# Patient Record
Sex: Female | Born: 1975 | Race: Black or African American | Hispanic: No | Marital: Single | Smoking: Current every day smoker
Health system: Southern US, Community
[De-identification: ages and names within clinical notes are randomized; demographics above are authoritative.]

## PROBLEM LIST (undated history)

## (undated) DIAGNOSIS — I10 Essential (primary) hypertension: Secondary | ICD-10-CM

## (undated) DIAGNOSIS — R51 Headache: Secondary | ICD-10-CM

## (undated) DIAGNOSIS — M1990 Unspecified osteoarthritis, unspecified site: Secondary | ICD-10-CM

## (undated) DIAGNOSIS — K219 Gastro-esophageal reflux disease without esophagitis: Secondary | ICD-10-CM

## (undated) DIAGNOSIS — Z8719 Personal history of other diseases of the digestive system: Secondary | ICD-10-CM

## (undated) DIAGNOSIS — R519 Headache, unspecified: Secondary | ICD-10-CM

## (undated) HISTORY — PX: TUBAL LIGATION: SHX77

## (undated) HISTORY — PX: SPINE SURGERY: SHX786

## (undated) HISTORY — PX: WISDOM TOOTH EXTRACTION: SHX21

## (undated) HISTORY — PX: BACK SURGERY: SHX140

---

## 2012-03-02 ENCOUNTER — Other Ambulatory Visit: Payer: Self-pay | Admitting: Family Medicine

## 2012-03-02 DIAGNOSIS — M545 Low back pain: Secondary | ICD-10-CM

## 2012-03-04 ENCOUNTER — Ambulatory Visit (HOSPITAL_COMMUNITY)
Admission: RE | Admit: 2012-03-04 | Discharge: 2012-03-04 | Disposition: A | Discharge status: Home / Self Care | Payer: Medicaid Other | Source: Ambulatory Visit | Attending: Family Medicine | Admitting: Family Medicine

## 2012-03-04 DIAGNOSIS — M5126 Other intervertebral disc displacement, lumbar region: Secondary | ICD-10-CM | POA: Insufficient documentation

## 2012-03-04 DIAGNOSIS — M545 Low back pain, unspecified: Secondary | ICD-10-CM | POA: Insufficient documentation

## 2012-03-04 DIAGNOSIS — R209 Unspecified disturbances of skin sensation: Secondary | ICD-10-CM | POA: Insufficient documentation

## 2012-07-19 ENCOUNTER — Ambulatory Visit (HOSPITAL_COMMUNITY): Admission: RE | Admit: 2012-07-19 | Payer: Medicaid Other | Source: Ambulatory Visit

## 2012-07-19 ENCOUNTER — Other Ambulatory Visit: Payer: Self-pay | Admitting: Family Medicine

## 2012-07-19 DIAGNOSIS — T148XXA Other injury of unspecified body region, initial encounter: Secondary | ICD-10-CM

## 2012-07-20 ENCOUNTER — Ambulatory Visit (HOSPITAL_COMMUNITY)
Admission: RE | Admit: 2012-07-20 | Discharge: 2012-07-20 | Disposition: A | Discharge status: Home / Self Care | Payer: Medicaid Other | Source: Ambulatory Visit | Attending: Family Medicine | Admitting: Family Medicine

## 2012-07-20 DIAGNOSIS — M25579 Pain in unspecified ankle and joints of unspecified foot: Secondary | ICD-10-CM | POA: Insufficient documentation

## 2012-07-20 DIAGNOSIS — S8263XA Displaced fracture of lateral malleolus of unspecified fibula, initial encounter for closed fracture: Secondary | ICD-10-CM | POA: Insufficient documentation

## 2012-07-20 DIAGNOSIS — T148XXA Other injury of unspecified body region, initial encounter: Secondary | ICD-10-CM

## 2012-07-20 DIAGNOSIS — X500XXA Overexertion from strenuous movement or load, initial encounter: Secondary | ICD-10-CM | POA: Insufficient documentation

## 2012-09-22 ENCOUNTER — Telehealth: Payer: Self-pay | Admitting: General Practice

## 2012-09-22 NOTE — Telephone Encounter (Signed)
Unable to reach patient on telephone number provided, invalid number.

## 2012-09-28 NOTE — Telephone Encounter (Signed)
FOUND ANOTHER NUMBER YOU CAN USE WOULD CALL ON THE RESULTS BUT DONT SEE THEM [phone]

## 2012-10-21 ENCOUNTER — Other Ambulatory Visit: Payer: Self-pay | Admitting: Neurosurgery

## 2012-12-10 ENCOUNTER — Encounter (HOSPITAL_COMMUNITY): Payer: Self-pay

## 2012-12-10 ENCOUNTER — Encounter (HOSPITAL_COMMUNITY)
Admission: RE | Admit: 2012-12-10 | Discharge: 2012-12-10 | Disposition: A | Discharge status: Home / Self Care | Payer: Medicaid Other | Source: Ambulatory Visit | Attending: Neurosurgery | Admitting: Neurosurgery

## 2012-12-10 HISTORY — DX: Unspecified osteoarthritis, unspecified site: M19.90

## 2012-12-10 HISTORY — DX: Personal history of other diseases of the digestive system: Z87.19

## 2012-12-10 LAB — CBC
HCT: 43 % (ref 36.0–46.0)
MCHC: 35.1 g/dL (ref 30.0–36.0)
MCV: 79.9 fL (ref 78.0–100.0)
Platelets: 234 10*3/uL (ref 150–400)
RDW: 13 % (ref 11.5–15.5)

## 2012-12-10 LAB — SURGICAL PCR SCREEN
MRSA, PCR: NEGATIVE
Staphylococcus aureus: NEGATIVE

## 2012-12-10 LAB — BASIC METABOLIC PANEL
BUN: 10 mg/dL (ref 6–23)
CO2: 23 mEq/L (ref 19–32)
Calcium: 8.9 mg/dL (ref 8.4–10.5)
Chloride: 104 mEq/L (ref 96–112)
Creatinine, Ser: 1.1 mg/dL (ref 0.50–1.10)

## 2012-12-10 NOTE — Pre-Procedure Instructions (Signed)
Toni Kim  12/10/2012   Your procedure is scheduled on:  12/21/12  Report to [facility] at 530 AM.  Call this number if you have problems the morning of surgery: [phone]   Remember:   Do not eat food or drink liquids after midnight.   Take these medicines the morning of surgery with A SIP OF WATER:    Do not wear jewelry, make-up or nail polish.  Do not wear lotions, powders, or perfumes. You may wear deodorant.  Do not shave 48 hours prior to surgery. Men may shave face and neck.  Do not bring valuables to the hospital.  [facility] is not responsible                   for any belongings or valuables.  Contacts, dentures or bridgework may not be worn into surgery.  Leave suitcase in the car. After surgery it may be brought to your room.  For patients admitted to the hospital, checkout time is 11:00 AM the day of  discharge.   Patients discharged the day of surgery will not be allowed to drive  home.  Name and phone number of your driver: family  Special Instructions: Shower using CHG 2 nights before surgery and the night before surgery.  If you shower the day of surgery use CHG.  Use special wash - you have one bottle of CHG for all showers.  You should use approximately 1/3 of the bottle for each shower.   Please read over the following fact sheets that you were given: Pain Booklet, Coughing and Deep Breathing, MRSA Information and Surgical Site Infection Prevention

## 2012-12-21 ENCOUNTER — Ambulatory Visit (HOSPITAL_COMMUNITY): Payer: Medicaid Other

## 2012-12-21 ENCOUNTER — Encounter (HOSPITAL_COMMUNITY): Payer: Self-pay | Admitting: Anesthesiology

## 2012-12-21 ENCOUNTER — Encounter (HOSPITAL_COMMUNITY)
Admission: RE | Disposition: A | Discharge status: Home / Self Care | Payer: Self-pay | Source: Ambulatory Visit | Attending: Neurosurgery

## 2012-12-21 ENCOUNTER — Ambulatory Visit (HOSPITAL_COMMUNITY): Payer: Medicaid Other | Admitting: Anesthesiology

## 2012-12-21 ENCOUNTER — Inpatient Hospital Stay (HOSPITAL_COMMUNITY)
Admission: RE | Admit: 2012-12-21 | Discharge: 2012-12-23 | DRG: 491 | Disposition: A | Discharge status: Home / Self Care | Payer: Medicaid Other | Source: Ambulatory Visit | Attending: Neurosurgery | Admitting: Neurosurgery

## 2012-12-21 DIAGNOSIS — Z79899 Other long term (current) drug therapy: Secondary | ICD-10-CM

## 2012-12-21 DIAGNOSIS — F3289 Other specified depressive episodes: Secondary | ICD-10-CM | POA: Diagnosis present

## 2012-12-21 DIAGNOSIS — F17200 Nicotine dependence, unspecified, uncomplicated: Secondary | ICD-10-CM | POA: Diagnosis present

## 2012-12-21 DIAGNOSIS — M5126 Other intervertebral disc displacement, lumbar region: Principal | ICD-10-CM | POA: Diagnosis present

## 2012-12-21 DIAGNOSIS — F329 Major depressive disorder, single episode, unspecified: Secondary | ICD-10-CM | POA: Diagnosis present

## 2012-12-21 DIAGNOSIS — Z01812 Encounter for preprocedural laboratory examination: Secondary | ICD-10-CM

## 2012-12-21 DIAGNOSIS — F1210 Cannabis abuse, uncomplicated: Secondary | ICD-10-CM | POA: Diagnosis present

## 2012-12-21 HISTORY — PX: LUMBAR LAMINECTOMY/DECOMPRESSION MICRODISCECTOMY: SHX5026

## 2012-12-21 SURGERY — LUMBAR LAMINECTOMY/DECOMPRESSION MICRODISCECTOMY 1 LEVEL
Anesthesia: General | Site: Back | Laterality: Right | Wound class: Clean

## 2012-12-21 MED ORDER — ONDANSETRON HCL 4 MG/2ML IJ SOLN: 4.0000 mg | INTRAMUSCULAR | Status: DC | PRN

## 2012-12-21 MED ORDER — FENTANYL CITRATE 0.05 MG/ML IJ SOLN
INTRAMUSCULAR | Status: AC
  Filled 2012-12-21: qty 2

## 2012-12-21 MED ORDER — BUPIVACAINE LIPOSOME 1.3 % IJ SUSP
INTRAMUSCULAR | Status: DC | PRN
  Administered 2012-12-21: 20 mL

## 2012-12-21 MED ORDER — PHENOL 1.4 % MT LIQD: 1.0000 | OROMUCOSAL | Status: DC | PRN

## 2012-12-21 MED ORDER — ONDANSETRON HCL 4 MG/2ML IJ SOLN
INTRAMUSCULAR | Status: DC | PRN
  Administered 2012-12-21: 4 mg via INTRAVENOUS

## 2012-12-21 MED ORDER — GLYCOPYRROLATE 0.2 MG/ML IJ SOLN
INTRAMUSCULAR | Status: DC | PRN
  Administered 2012-12-21: 0.6 mg via INTRAVENOUS

## 2012-12-21 MED ORDER — BUPIVACAINE LIPOSOME 1.3 % IJ SUSP
20.0000 mL | INTRAMUSCULAR | Status: AC
  Filled 2012-12-21: qty 20

## 2012-12-21 MED ORDER — ONDANSETRON HCL 4 MG/2ML IJ SOLN: 4.0000 mg | Freq: Once | INTRAMUSCULAR | Status: DC | PRN

## 2012-12-21 MED ORDER — METHYLPREDNISOLONE ACETATE 80 MG/ML IJ SUSP
INTRAMUSCULAR | Status: DC | PRN
  Administered 2012-12-21: 80 mg

## 2012-12-21 MED ORDER — LIDOCAINE HCL (CARDIAC) 20 MG/ML IV SOLN
INTRAVENOUS | Status: DC | PRN
  Administered 2012-12-21: 100 mg via INTRAVENOUS

## 2012-12-21 MED ORDER — THROMBIN 5000 UNITS EX SOLR
CUTANEOUS | Status: DC | PRN
  Administered 2012-12-21 (×2): 5000 [IU] via TOPICAL

## 2012-12-21 MED ORDER — FENTANYL CITRATE 0.05 MG/ML IJ SOLN
INTRAMUSCULAR | Status: DC | PRN
  Administered 2012-12-21: 100 ug via INTRAVENOUS

## 2012-12-21 MED ORDER — 0.9 % SODIUM CHLORIDE (POUR BTL) OPTIME
TOPICAL | Status: DC | PRN
  Administered 2012-12-21: 1000 mL

## 2012-12-21 MED ORDER — MORPHINE SULFATE 2 MG/ML IJ SOLN
1.0000 mg | INTRAMUSCULAR | Status: DC | PRN
  Administered 2012-12-21 (×2): 4 mg via INTRAVENOUS
  Administered 2012-12-21 – 2012-12-22 (×4): 2 mg via INTRAVENOUS
  Administered 2012-12-22: 4 mg via INTRAVENOUS
  Administered 2012-12-23 (×2): 2 mg via INTRAVENOUS
  Filled 2012-12-21 (×3): qty 1
  Filled 2012-12-21 (×2): qty 2
  Filled 2012-12-21 (×3): qty 1
  Filled 2012-12-21: qty 2

## 2012-12-21 MED ORDER — MENTHOL 3 MG MT LOZG: 1.0000 | LOZENGE | OROMUCOSAL | Status: DC | PRN

## 2012-12-21 MED ORDER — ACETAMINOPHEN 650 MG RE SUPP: 650.0000 mg | RECTAL | Status: DC | PRN

## 2012-12-21 MED ORDER — HYDROMORPHONE HCL PF 1 MG/ML IJ SOLN
INTRAMUSCULAR | Status: AC
  Filled 2012-12-21: qty 1

## 2012-12-21 MED ORDER — SODIUM CHLORIDE 0.9 % IV SOLN
250.0000 mL | INTRAVENOUS | Status: DC
Start: 2012-12-21 — End: 2012-12-23

## 2012-12-21 MED ORDER — SODIUM CHLORIDE 0.9 % IJ SOLN
3.0000 mL | Freq: Two times a day (BID) | INTRAMUSCULAR | Status: DC
  Administered 2012-12-21 – 2012-12-22 (×2): 3 mL via INTRAVENOUS

## 2012-12-21 MED ORDER — ZOLPIDEM TARTRATE 5 MG PO TABS
5.0000 mg | ORAL_TABLET | Freq: Every evening | ORAL | Status: DC | PRN
  Administered 2012-12-22: 5 mg via ORAL
  Filled 2012-12-21: qty 1

## 2012-12-21 MED ORDER — MIDAZOLAM HCL 5 MG/5ML IJ SOLN
INTRAMUSCULAR | Status: DC | PRN
  Administered 2012-12-21: 2 mg via INTRAVENOUS

## 2012-12-21 MED ORDER — ACETAMINOPHEN 325 MG PO TABS: 650.0000 mg | ORAL_TABLET | ORAL | Status: DC | PRN

## 2012-12-21 MED ORDER — LIDOCAINE HCL 4 % MT SOLN
OROMUCOSAL | Status: DC | PRN
  Administered 2012-12-21: 4 mL via TOPICAL

## 2012-12-21 MED ORDER — ROCURONIUM BROMIDE 100 MG/10ML IV SOLN
INTRAVENOUS | Status: DC | PRN
  Administered 2012-12-21: 50 mg via INTRAVENOUS

## 2012-12-21 MED ORDER — SODIUM CHLORIDE 0.9 % IV SOLN
INTRAVENOUS | Status: DC
  Administered 2012-12-21 – 2012-12-23 (×3): via INTRAVENOUS

## 2012-12-21 MED ORDER — HEMOSTATIC AGENTS (NO CHARGE) OPTIME
TOPICAL | Status: DC | PRN
  Administered 2012-12-21: 1 via TOPICAL

## 2012-12-21 MED ORDER — SODIUM CHLORIDE 0.9 % IJ SOLN: 3.0000 mL | INTRAMUSCULAR | Status: DC | PRN

## 2012-12-21 MED ORDER — GABAPENTIN 300 MG PO CAPS
300.0000 mg | ORAL_CAPSULE | Freq: Three times a day (TID) | ORAL | Status: DC
  Administered 2012-12-21 – 2012-12-23 (×6): 300 mg via ORAL
  Filled 2012-12-21 (×8): qty 1

## 2012-12-21 MED ORDER — SENNA 8.6 MG PO TABS
1.0000 | ORAL_TABLET | Freq: Two times a day (BID) | ORAL | Status: DC
  Administered 2012-12-21 – 2012-12-23 (×4): 8.6 mg via ORAL
  Filled 2012-12-21 (×5): qty 1

## 2012-12-21 MED ORDER — FENTANYL CITRATE 0.05 MG/ML IJ SOLN
INTRAMUSCULAR | Status: DC | PRN
  Administered 2012-12-21 (×2): 100 ug via INTRAVENOUS
  Administered 2012-12-21: 50 ug via INTRAVENOUS

## 2012-12-21 MED ORDER — NEOSTIGMINE METHYLSULFATE 1 MG/ML IJ SOLN
INTRAMUSCULAR | Status: DC | PRN
  Administered 2012-12-21: 4 mg via INTRAVENOUS

## 2012-12-21 MED ORDER — HYDROMORPHONE HCL PF 1 MG/ML IJ SOLN
0.2500 mg | INTRAMUSCULAR | Status: DC | PRN
  Administered 2012-12-21 (×6): 0.5 mg via INTRAVENOUS

## 2012-12-21 MED ORDER — CEFAZOLIN SODIUM 1-5 GM-% IV SOLN
1.0000 g | Freq: Three times a day (TID) | INTRAVENOUS | Status: AC
  Administered 2012-12-21 (×2): 1 g via INTRAVENOUS
  Filled 2012-12-21 (×4): qty 50

## 2012-12-21 MED ORDER — OXYCODONE-ACETAMINOPHEN 5-325 MG PO TABS
1.0000 | ORAL_TABLET | ORAL | Status: DC | PRN
  Administered 2012-12-21: 2 via ORAL
  Administered 2012-12-21: 1 via ORAL
  Administered 2012-12-22 – 2012-12-23 (×6): 2 via ORAL
  Filled 2012-12-21 (×8): qty 2

## 2012-12-21 MED ORDER — PROPOFOL 10 MG/ML IV BOLUS
INTRAVENOUS | Status: DC | PRN
  Administered 2012-12-21: 200 mg via INTRAVENOUS

## 2012-12-21 MED ORDER — LACTATED RINGERS IV SOLN
INTRAVENOUS | Status: DC | PRN
  Administered 2012-12-21 (×2): via INTRAVENOUS

## 2012-12-21 MED ORDER — CEFAZOLIN SODIUM-DEXTROSE 2-3 GM-% IV SOLR
INTRAVENOUS | Status: AC
  Administered 2012-12-21: 2 g via INTRAVENOUS
  Filled 2012-12-21: qty 50

## 2012-12-21 SURGICAL SUPPLY — 55 items
BENZOIN TINCTURE PRP APPL 2/3 (GAUZE/BANDAGES/DRESSINGS) ×2 IMPLANT
BLADE SURG ROTATE 9660 (MISCELLANEOUS) IMPLANT
BUR ACORN 6.0 (BURR) ×2 IMPLANT
BUR MATCHSTICK NEURO 3.0 LAGG (BURR) ×2 IMPLANT
CANISTER SUCTION 2500CC (MISCELLANEOUS) ×2 IMPLANT
CLOTH BEACON ORANGE TIMEOUT ST (SAFETY) ×2 IMPLANT
CONT SPEC 4OZ CLIKSEAL STRL BL (MISCELLANEOUS) ×2 IMPLANT
DRAPE LAPAROTOMY 100X72X124 (DRAPES) ×2 IMPLANT
DRAPE MICROSCOPE LEICA (MISCELLANEOUS) ×2 IMPLANT
DRAPE POUCH INSTRU U-SHP 10X18 (DRAPES) ×2 IMPLANT
DRSG PAD ABDOMINAL 8X10 ST (GAUZE/BANDAGES/DRESSINGS) IMPLANT
DURAPREP 26ML APPLICATOR (WOUND CARE) ×2 IMPLANT
ELECT BLADE 4.0 EZ CLEAN MEGAD (MISCELLANEOUS) ×2
ELECT REM PT RETURN 9FT ADLT (ELECTROSURGICAL) ×2
ELECTRODE BLDE 4.0 EZ CLN MEGD (MISCELLANEOUS) ×1 IMPLANT
ELECTRODE REM PT RTRN 9FT ADLT (ELECTROSURGICAL) ×1 IMPLANT
GAUZE SPONGE 4X4 16PLY XRAY LF (GAUZE/BANDAGES/DRESSINGS) IMPLANT
GLOVE BIOGEL M 8.0 STRL (GLOVE) ×4 IMPLANT
GLOVE BIOGEL PI IND STRL 8 (GLOVE) ×3 IMPLANT
GLOVE BIOGEL PI INDICATOR 8 (GLOVE) ×3
GLOVE ECLIPSE 7.5 STRL STRAW (GLOVE) ×8 IMPLANT
GLOVE EXAM NITRILE LRG STRL (GLOVE) IMPLANT
GLOVE EXAM NITRILE MD LF STRL (GLOVE) IMPLANT
GLOVE EXAM NITRILE XL STR (GLOVE) IMPLANT
GLOVE EXAM NITRILE XS STR PU (GLOVE) IMPLANT
GOWN BRE IMP SLV AUR LG STRL (GOWN DISPOSABLE) ×2 IMPLANT
GOWN BRE IMP SLV AUR XL STRL (GOWN DISPOSABLE) ×2 IMPLANT
GOWN STRL REIN 2XL LVL4 (GOWN DISPOSABLE) ×4 IMPLANT
KIT BASIN OR (CUSTOM PROCEDURE TRAY) ×2 IMPLANT
KIT ROOM TURNOVER OR (KITS) ×2 IMPLANT
NEEDLE HYPO 18GX1.5 BLUNT FILL (NEEDLE) ×2 IMPLANT
NEEDLE HYPO 21X1.5 SAFETY (NEEDLE) ×2 IMPLANT
NEEDLE HYPO 25X1 1.5 SAFETY (NEEDLE) ×2 IMPLANT
NEEDLE SPNL 20GX3.5 QUINCKE YW (NEEDLE) ×2 IMPLANT
NS IRRIG 1000ML POUR BTL (IV SOLUTION) ×2 IMPLANT
PACK LAMINECTOMY NEURO (CUSTOM PROCEDURE TRAY) ×2 IMPLANT
PAD ARMBOARD 7.5X6 YLW CONV (MISCELLANEOUS) ×10 IMPLANT
PATTIES SURGICAL .5 X1 (DISPOSABLE) ×2 IMPLANT
RUBBERBAND STERILE (MISCELLANEOUS) ×4 IMPLANT
SENSORCAINE 0.5% W/EPI 1:200,000 IMPLANT
SPONGE GAUZE 4X4 12PLY (GAUZE/BANDAGES/DRESSINGS) ×2 IMPLANT
SPONGE LAP 4X18 X RAY DECT (DISPOSABLE) IMPLANT
SPONGE SURGIFOAM ABS GEL SZ50 (HEMOSTASIS) ×2 IMPLANT
STRIP CLOSURE SKIN 1/2X4 (GAUZE/BANDAGES/DRESSINGS) ×2 IMPLANT
SUT VIC AB 0 CT1 18XCR BRD8 (SUTURE) ×1 IMPLANT
SUT VIC AB 0 CT1 8-18 (SUTURE) ×1
SUT VIC AB 2-0 CP2 18 (SUTURE) ×2 IMPLANT
SUT VIC AB 3-0 SH 8-18 (SUTURE) ×2 IMPLANT
SYR 20CC LL (SYRINGE) ×2 IMPLANT
SYR 20ML ECCENTRIC (SYRINGE) ×2 IMPLANT
SYR 5ML LL (SYRINGE) ×2 IMPLANT
TAPE CLOTH SURG 4X10 WHT LF (GAUZE/BANDAGES/DRESSINGS) ×2 IMPLANT
TOWEL OR 17X24 6PK STRL BLUE (TOWEL DISPOSABLE) ×2 IMPLANT
TOWEL OR 17X26 10 PK STRL BLUE (TOWEL DISPOSABLE) ×2 IMPLANT
WATER STERILE IRR 1000ML POUR (IV SOLUTION) ×2 IMPLANT

## 2012-12-21 NOTE — Anesthesia Preprocedure Evaluation (Addendum)
Anesthesia Evaluation  Patient identified by MRN, date of birth, ID band Patient awake, Patient confused and Patient unresponsive    Reviewed: Allergy & Precautions, H&P , NPO status , Patient's Chart, lab work & pertinent test results, reviewed documented beta blocker date and time   Airway Mallampati: I TM Distance: >3 FB Neck ROM: full    Dental  (+) Teeth Intact and Dental Advisory Given   Pulmonary          Cardiovascular Rhythm:regular Rate:Normal     Neuro/Psych    GI/Hepatic hiatal hernia,   Endo/Other    Renal/GU      Musculoskeletal   Abdominal   Peds  Hematology   Anesthesia Other Findings   Reproductive/Obstetrics                          Anesthesia Physical Anesthesia Plan  ASA: I  Anesthesia Plan: General   Post-op Pain Management:    Induction: Intravenous  Airway Management Planned: Oral ETT  Additional Equipment:   Intra-op Plan:   Post-operative Plan: Extubation in OR  Informed Consent: I have reviewed the patients History and Physical, chart, labs and discussed the procedure including the risks, benefits and alternatives for the proposed anesthesia with the patient or authorized representative who has indicated his/her understanding and acceptance.   Dental advisory given  Plan Discussed with: CRNA, Anesthesiologist and Surgeon  Anesthesia Plan Comments:        Anesthesia Quick Evaluation

## 2012-12-21 NOTE — Progress Notes (Signed)
Report from Macon Large for care of patient

## 2012-12-21 NOTE — H&P (Signed)
Kathleene Bergemann is an 37 y.o. female.   Chief Complaint: right leg pain HPI: patient complaining of lumbar pain with radiation to the right leg for almost one yeat with some sensory changes in the right hand.she is not better with conservative treatment  Past Medical History  Diagnosis Date  . H/O hiatal hernia   . Arthritis     Past Surgical History  Procedure Laterality Date  . Cesarean section      No family history on file. Social History:  reports that she has been smoking.  She does not have any smokeless tobacco history on file. She reports that she uses illicit drugs (Marijuana). She reports that she does not drink alcohol.  Allergies: No Known Allergies  Medications Prior to Admission  Medication Sig Dispense Refill  . cyclobenzaprine (FLEXERIL) 10 MG tablet Take 10 mg by mouth 3 (three) times daily as needed for muscle spasms.      Marland Kitchen gabapentin (NEURONTIN) 300 MG capsule Take 300 mg by mouth 3 (three) times daily.      Marland Kitchen oxycodone (OXY-IR) 5 MG capsule Take 5 mg by mouth every 8 (eight) hours as needed for pain.        No results found for this or any previous visit (from the past 48 hour(s)). No results found.  Review of Systems  Constitutional: Negative.   HENT: Negative.   Eyes: Negative.   Respiratory: Negative.   Cardiovascular: Negative.   Gastrointestinal: Negative.   Genitourinary: Negative.   Musculoskeletal: Negative.   Skin: Negative.   Neurological: Positive for sensory change and focal weakness.  Endo/Heme/Allergies: Negative.   Psychiatric/Behavioral: Positive for depression. The patient is nervous/anxious.     Blood pressure 127/91, pulse 77, temperature 97.1 F (36.2 C), temperature source Oral, resp. rate 20, SpO2 97.00%. Physical Exam hent,nl. Neck, nl. Cv, nl. Lungs, clear. Abdomen, soft. Extremities, nl. NEURO WEAKNESS OF THENAR MUSCLES. WEAKNESS OF df OF THE RIGHT FOOT AND QUADRICEPS. FEMORAL STRECH  Positive in the right. Mri, shows large  hnp at l3-4 central to the right and foraminal stenosis at 45 to right.  Assessment/Plan She is for right l3-4 and 4-5 discectomy and foraminotomy. Aware of risks and benefits  Salena Ortlieb M 12/21/2012, 7:45 AM

## 2012-12-21 NOTE — Preoperative (Signed)
Beta Blockers   Reason not to administer Beta Blockers:Not Applicable. No home beta blockers 

## 2012-12-21 NOTE — Anesthesia Postprocedure Evaluation (Signed)
  Anesthesia Post-op Note  Patient: Toni Kim  Procedure(s) Performed: Procedure(s) with comments: LUMBAR LAMINECTOMY/DECOMPRESSION MICRODISCECTOMY 2 level (Right) - Right Lumbar three-four,Lumbar four-five Microdiskectomy  Patient Location: PACU  Anesthesia Type:General  Level of Consciousness: awake, alert , oriented and patient cooperative  Airway and Oxygen Therapy: Patient Spontanous Breathing  Post-op Pain: moderate  Post-op Assessment: Post-op Vital signs reviewed, Patient's Cardiovascular Status Stable, Respiratory Function Stable, Patent Airway, No signs of Nausea or vomiting and Pain level controlled  Post-op Vital Signs: stable  Complications: No apparent anesthesia complications

## 2012-12-21 NOTE — Transfer of Care (Signed)
Immediate Anesthesia Transfer of Care Note  Patient: Toni Kim  Procedure(s) Performed: Procedure(s) with comments: LUMBAR LAMINECTOMY/DECOMPRESSION MICRODISCECTOMY 2 level (Right) - Right Lumbar three-four,Lumbar four-five Microdiskectomy  Patient Location: PACU  Anesthesia Type:General  Level of Consciousness: awake and responds to stimulation  Airway & Oxygen Therapy: Patient Spontanous Breathing and Patient connected to nasal cannula oxygen  Post-op Assessment: Report given to PACU RN and Post -op Vital signs reviewed and stable  Post vital signs: Reviewed and stable  Complications: No apparent anesthesia complications

## 2012-12-21 NOTE — Progress Notes (Signed)
Utilization review completed.  

## 2012-12-21 NOTE — Progress Notes (Signed)
With patient's chart was a silver heart necklace, floor nurse received it with chart

## 2012-12-21 NOTE — Progress Notes (Signed)
Op note [phone]

## 2012-12-21 NOTE — Plan of Care (Signed)
Problem: Phase I Progression Outcomes Goal: OOB as tolerated unless otherwise ordered Outcome: Progressing Patient able to be up to the bathroom with minimal standby assist.  Goal: Log roll for position change Outcome: Progressing Patient able to reposition herself in bed to maximize her comfort. Goal: Initial discharge plan identified Outcome: Progressing Patient intends to return home after discharge barring any complications.

## 2012-12-22 NOTE — Progress Notes (Signed)
I agree with the following treatment note after reviewing documentation.   Johnston, Aidel Davisson Brynn   OTR/L Pager: [phone] Office: [phone] .   

## 2012-12-22 NOTE — Evaluation (Signed)
Physical Therapy Evaluation Patient Details Name: Toni Kim MRN: [id number] DOB: [birth year] Today's Date: 12/22/2012 Time: 9562-1308 PT Time Calculation (min): 23 min  PT Assessment / Plan / Recommendation History of Present Illness  Pt is a pleasant 37 y o F who is s/p for L3-5 discetomy and laminotomy  Clinical Impression  Ambulating on presentation in the hall pushing her IV. Education provided concerning back precautions with mobility. Patient able to perform stairs and ambulate at supervision-modified independent level. Will have appropriate assist from boyfriend and children at home. No further acute PT needs at this time.     PT Assessment  Patent does not need any further PT services    Follow Up Recommendations  No PT follow up    Does the patient have the potential to tolerate intense rehabilitation      Barriers to Discharge        Equipment Recommendations    None   Recommendations for Other Services     Frequency      Precautions / Restrictions Precautions Precautions: Back Precaution Booklet Issued: Yes (comment) (BAT) Restrictions Weight Bearing Restrictions: No   Pertinent Vitals/Pain Reports 8/10 pain but that ambulation helps this      Mobility  Bed Mobility Bed Mobility: Sit to Sidelying Right;Right Sidelying to Sit Right Sidelying to Sit: 6: Modified independent (Device/Increase time);HOB flat Sit to Sidelying Right: 6: Modified independent (Device/Increase time);HOB flat Details for Bed Mobility Assistance: Pt edcuated on bed mobility with log roll and pushing up from side. Able to perform independently after initial demonstration and 1 verbal cue Transfers Transfers: Sit to Stand;Stand to Sit Sit to Stand: 6: Modified independent (Device/Increase time);With upper extremity assist;From bed Stand to Sit: 6: Modified independent (Device/Increase time);With upper extremity assist;To bed;To chair/3-in-1 Details for Transfer Assistance: Pt with min  vc's for safe hand placement and maintaining back precautions Ambulation/Gait Ambulation/Gait Assistance: 5: Supervision Ambulation Distance (Feet): 600 Feet Assistive device: None Ambulation/Gait Assistance Details: occasional v/c's for back precautions especially twisting Gait Pattern: Step-through pattern;Wide base of support Gait velocity: decreased Stairs: Yes Stairs Assistance: 6: Modified independent (Device/Increase time) Stair Management Technique: One rail Right Number of Stairs: 6        PT Goals(Current goals can be found in the care plan section) Acute Rehab PT Goals Patient Stated Goal: To get home. PT Goal Formulation: No goals set, d/c therapy  Visit Information  Last PT Received On: 12/22/12 Assistance Needed: +1 History of Present Illness: Pt is a pleasant 89 y o F who is s/p for L3-5 discetomy and laminotomy       Prior Functioning  Home Living Family/patient expects to be discharged to:: Private residence Living Arrangements: Spouse/significant other;Children Available Help at Discharge: Family Type of Home: Apartment Home Access: Stairs to enter Secretary/administrator of Steps: 8 Entrance Stairs-Rails: Right Home Layout: One level Home Equipment: None Additional Comments: children at home 9 and 14; the 31 y/o helps with cooking and her boyfriend will be off for 8 days to help out while she recovers Prior Function Level of Independence: Independent Communication Communication: No difficulties Dominant Hand: Right    Cognition  Cognition Arousal/Alertness: Awake/alert Behavior During Therapy: WFL for tasks assessed/performed Overall Cognitive Status: Within Functional Limits for tasks assessed    Extremity/Trunk Assessment Upper Extremity Assessment Upper Extremity Assessment: Overall WFL for tasks assessed Lower Extremity Assessment Lower Extremity Assessment: Overall WFL for tasks assessed Cervical / Trunk Assessment Cervical / Trunk  Assessment: Normal   Balance  End of Session PT - End of Session Equipment Utilized During Treatment: Gait belt Activity Tolerance: Patient tolerated treatment well Patient left: in chair;with call bell/phone within reach;with family/visitor present Nurse Communication: Mobility status  GP     [facility] HELEN 12/22/2012, 12:29 PM

## 2012-12-22 NOTE — Op Note (Signed)
[name], Toni Kim NO.:  [id number]  MEDICAL RECORD NO.:  [id number]  LOCATION:  [id number]                        FACILITY:  [facility]  PHYSICIAN:  Hilda Lias, M.D.   DATE OF BIRTH:  [birth year]  DATE OF PROCEDURE:  12/21/2012 DATE OF DISCHARGE:                              OPERATIVE REPORT   PREOPERATIVE DIAGNOSIS:  Right L3-L4, L4-L5 herniated disk with chronic radiculopathy.  POSTOPERATIVE DIAGNOSIS:  Right L3-L4, L4-L5 herniated disk with chronic radiculopathy.  PROCEDURE:  Right L4 hemilaminectomy, right L3-L4 diskectomy and foraminotomy, right L4-L5 diskectomy and foraminotomy, microscope.  SURGEON:  Hilda Lias, M.D.  ASSISTANT:  Kritzer__________.  CLINICAL HISTORY:  The patient had been complaining for more than a year of pain radiating to the right leg associated with weakness of the quadriceps and dorsiflexion.  Because of work, she decided to postpone surgery until now.  The patient knew the risk with the surgery including the possibility of no improvement, and need of further surgery.  Also infection of CSF leak.  PROCEDURE IN DETAIL:  The patient was taken to the OR, and she was positioned in a prone manner.  Because of her size, it was difficult to feel any bony structure.  X-ray showed that we were right at the level of L3-4.  After we prepped the skin with DuraPrep, we opened the skin through the subcutaneous muscle and retracted laterally.  Another x-ray showed indeed we were right at the level of L3-L4, L4-L5.  We brought the microscope into the area to see better the operative site.  With the drill, we drilled the hemilamina of L4, and we did remove part of the lower lamina of L3 and the upper of L5.  A thick calcified ligament was also excised.  We started at L3-4, where we found that the L3 nerve root was attached to the floor.  The patient had quite a bit of adhesion lysis was accomplished, and we found that she has a calcified  disk going to the borders of L3.  Incision in the disk was made.  We entered the disk space and diskectomy was achieved.  Then, foraminotomy to decompress the L3-L4 nerve root was done.  At the level of L4-5, the disk was quite narrow.  Although, we entered the disk space.  There was no lot of material inside.  Nevertheless, we were able to decompress the L5 nerve root with plenty of space for the thecal sac.  From then on, Valsalva maneuver was negative.  The area was irrigated.  Fentanyl and Depo-Medrol were left in the epidural space and the wound was closed with Vicryl and Steri-Strips.         ______________________________ Hilda Lias, M.D.    EB/MEDQ  D:  12/21/2012  T:  12/21/2012  Job:  [id number]

## 2012-12-22 NOTE — Progress Notes (Signed)
Occupational Therapy Evaluation Patient Details Name: Toni Kim MRN: [id number] DOB: 05-31-1976 Today's Date: 12/22/2012 Time: 9629-5284 OT Time Calculation (min): 32 min  OT Assessment / Plan / Recommendation History of present illness Pt is a pleasant 17 y o F who is s/p for L3-5 discetomy and lami   Clinical Impression   Pt performing really well. Modified independent in ADL. Pt would benefit from skilled OT services while in acute care to re-enforce back precautions and address LB ADL, and practice tub transfer before d/c home.    OT Assessment  Patient needs continued OT Services    Follow Up Recommendations  No OT follow up             Frequency  Min 2X/week    Precautions / Restrictions Precautions Precautions: Back Precaution Booklet Issued: Yes (comment) (BAT) Restrictions Weight Bearing Restrictions: No   Pertinent Vitals/Pain Pt reported 8/10 pain.Pain decr with walking, and RN notified that Pt requested pain meds at the end of session.    ADL  Eating/Feeding: Independent Where Assessed - Eating/Feeding: Edge of bed Grooming: Wash/dry hands;Wash/dry face;Modified independent Where Assessed - Grooming: Unsupported standing Upper Body Bathing: Supervision/safety Where Assessed - Upper Body Bathing: Unsupported sitting Lower Body Bathing: Minimal assistance Where Assessed - Lower Body Bathing: Unsupported sitting Upper Body Dressing: Supervision/safety Where Assessed - Upper Body Dressing: Unsupported sitting Lower Body Dressing: Minimal assistance Where Assessed - Lower Body Dressing: Unsupported sitting Toilet Transfer: Modified independent Toilet Transfer Method: Sit to stand Toilet Transfer Equipment: Regular height toilet;Grab bars (has vanity beside commode at home) Toileting - Architect and Hygiene: Modified independent Where Assessed - Engineer, mining and Hygiene: Standing Tub/Shower Transfer:  Supervision/safety Tub/Shower Transfer Method: Ambulating Equipment Used: Gait belt Transfers/Ambulation Related to ADLs: Pt supervision for transfers. Would be modified independent, but Pt needed mod vc's to maintain back precautions. Pt ambulating at modified independent. ADL Comments: Pt needs mod cues to maintain back precautions. Pt instructed on cup method for brushing teeth, IADL at home and keeping reach between head and hips. When OTS entered, Pt was straightening bed, and educated on bending. Pt educated on LB dressing and bathing. Pt not able to bring foot to knee, so AE would be good to show Pt. Pt able to perform peri care and maintain precautions without AE. Pt likes to be active and moving. Requested walk around the floor.    OT Diagnosis:    OT Problem List: Decreased safety awareness;Decreased knowledge of use of DME or AE;Decreased knowledge of precautions OT Treatment Interventions: Self-care/ADL training;DME and/or AE instruction;Patient/family education   OT Goals(Current goals can be found in the care plan section) Acute Rehab OT Goals Patient Stated Goal: To get home. OT Goal Formulation: With patient Time For Goal Achievement: 01/05/13 Potential to Achieve Goals: Good ADL Goals Pt Will Perform Lower Body Dressing: with modified independence;with adaptive equipment;sit to/from stand Pt Will Perform Tub/Shower Transfer: with supervision;with caregiver independent in assisting Additional ADL Goal #1: Pt will recall 3/3 back precautions prior to ADL  Visit Information  Last OT Received On: 12/22/12 Assistance Needed: +1 History of Present Illness: Pt is a pleasant 37 y o F who is s/p for L3-5 discetomy and laminotomy       Prior Functioning     Home Living Family/patient expects to be discharged to:: Private residence Living Arrangements: Spouse/significant other;Children Available Help at Discharge: Family Type of Home: Apartment Home Access: Stairs to  enter [employer] of Steps: 8 Entrance Stairs-Rails:  Right Home Layout: One level Home Equipment: None Prior Function Level of Independence: Independent Communication Communication: No difficulties Dominant Hand: Right         Vision/Perception Vision - History Baseline Vision: No visual deficits Patient Visual Report: No change from baseline   Cognition  Cognition Arousal/Alertness: Awake/alert Behavior During Therapy: WFL for tasks assessed/performed Overall Cognitive Status: Within Functional Limits for tasks assessed    Extremity/Trunk Assessment Upper Extremity Assessment Upper Extremity Assessment: Overall WFL for tasks assessed Lower Extremity Assessment Lower Extremity Assessment: Overall WFL for tasks assessed Cervical / Trunk Assessment Cervical / Trunk Assessment: Normal     Mobility Bed Mobility Bed Mobility: Not assessed Details for Bed Mobility Assistance: Pt edcuated on bed mobility with log roll and pushing up from side. Pt given handout and stated that "rolling is how she is used to getting out of bed" Transfers Transfers: Sit to Stand;Stand to Sit Sit to Stand: 6: Modified independent (Device/Increase time);With upper extremity assist;From bed;From toilet Stand to Sit: 6: Modified independent (Device/Increase time);With upper extremity assist;To bed;To toilet;To chair/3-in-1 Details for Transfer Assistance: Pt with min vc's for safe hand placement and maintaining back precautions           End of Session OT - End of Session Equipment Utilized During Treatment: Gait belt Activity Tolerance: Patient tolerated treatment well Patient left: in bed;with call bell/phone within reach;with family/visitor present Nurse Communication: Patient requests pain meds;Mobility status  GO     Sherryl Manges 12/22/2012, 9:34 AM

## 2012-12-23 ENCOUNTER — Encounter (HOSPITAL_COMMUNITY): Payer: Self-pay | Admitting: Neurosurgery

## 2012-12-23 NOTE — Progress Notes (Signed)
Occupational Therapy Treatment Patient Details Name: Toni Kim MRN: [id number] DOB: 12-Mar-1976 Today's Date: 12/23/2012 Time: 5284-1324 OT Time Calculation (min): 18 min  OT Assessment / Plan / Recommendation  OT comments  Pt progressing towards goals. Pt performed tub transfer and peri care at modified independent level. Pt issued Hip Kit from grant, and received education on dressing and bathing LB. Pt continues to benefit from skilled OT in the acute setting.   Follow Up Recommendations  No OT follow up       Equipment Recommendations  Other (comment) (Pt issued a Hip Kit from grant money)       Frequency Min 2X/week   Progress towards OT Goals Progress towards OT goals: Progressing toward goals  Plan Discharge plan remains appropriate    Precautions / Restrictions Precautions Precautions: Back Precaution Booklet Issued: Yes (comment) Precaution Comments: Pt able to recall 3/3 back precautions Restrictions Weight Bearing Restrictions: No   Pertinent Vitals/Pain Pt reported 8/10 pain, walked and it decr pain, also repositioned in bed.    ADL  Grooming: Wash/dry hands;Modified independent Where Assessed - Grooming: Unsupported standing Lower Body Bathing: Minimal assistance Where Assessed - Lower Body Bathing: Supported standing Lower Body Dressing: Other (comment);Minimal assistance (educated and given AE for LB dressing and bathing) Where Assessed - Lower Body Dressing: Unsupported sitting Toilet Transfer: Modified independent Toilet Transfer Method: Sit to Barista: Regular height toilet;Grab bars Toileting - Clothing Manipulation and Hygiene: Modified independent Where Assessed - Engineer, mining and Hygiene: Standing Tub/Shower Transfer: Modified independent Tub/Shower Transfer Method: Stand pivot Equipment Used: Gait belt;Long-handled sponge;Reacher;Sock aid Transfers/Ambulation Related to ADLs: Pt modified independent  with transfers, min vc's to correct bending ADL Comments: Pt issued HIP KIT with [employer] grant money. Pt Educated that it was a one time issue and to hold onto equipment. Pt was educated on how to use equipment (sock donner, reacher/grabber, shoe horn, long handled sponge) to address LB ADL. Pt modifed independent for toileting and peri care while maintaining precautions, and performed tub transfer safely.     OT Goals(current goals can now be found in the care plan section) Acute Rehab OT Goals Patient Stated Goal: To have a bowel movement and go home OT Goal Formulation: With patient Time For Goal Achievement: 01/05/13 Potential to Achieve Goals: Good ADL Goals Pt Will Perform Lower Body Dressing: with modified independence;with adaptive equipment;sit to/from stand Pt Will Perform Tub/Shower Transfer: with supervision;with caregiver independent in assisting Additional ADL Goal #1: Pt will recall 3/3 back precautions prior to ADL  Visit Information  Last OT Received On: 12/23/12 Assistance Needed: +1 History of Present Illness: Pt is a pleasant 37 y o F who is s/p for L3-5 discetomy and laminotomy          Cognition  Cognition Arousal/Alertness: Awake/alert Behavior During Therapy: WFL for tasks assessed/performed Overall Cognitive Status: Within Functional Limits for tasks assessed    Mobility  Bed Mobility Bed Mobility: Not assessed Transfers Transfers: Sit to Stand;Stand to Sit Sit to Stand: 6: Modified independent (Device/Increase time);With upper extremity assist;From bed Stand to Sit: 6: Modified independent (Device/Increase time);With upper extremity assist;To bed;To chair/3-in-1 Details for Transfer Assistance: Pt with min vc's for safe hand placement and maintaining back precautions          End of Session OT - End of Session Equipment Utilized During Treatment: Gait belt Activity Tolerance: Patient tolerated treatment well Patient left: in bed;with call  bell/phone within reach;with family/visitor present Nurse Communication: Mobility  status  GO     Sherryl Manges 12/23/2012, 9:13 AM

## 2012-12-23 NOTE — Progress Notes (Signed)
I agree with the following treatment note after reviewing documentation.   Johnston, Mary-Anne Polizzi Brynn   OTR/L Pager: [phone] Office: [phone] .   

## 2012-12-23 NOTE — Discharge Summary (Signed)
Physician Discharge Summary  Patient ID: Toni Kim MRN: [id number] DOB/AGE: 1976-01-28 37 y.o.  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 *   Discharged Condition: no pain  Hospital Course: surgery  Consults:none  Significant Diagnostic Studies:myelo  Treatments: right l34, 45 discectomy   Discharge Exam: Blood pressure 135/86, pulse 84, temperature 97.9 F (36.6 C), temperature source Oral, resp. rate 18, height 5\' 5"  (1.651 m), weight 107.9 kg (237 lb 14 oz), SpO2 100.00%. No pain  Disposition: home     Medication List    ASK your doctor about these medications       cyclobenzaprine 10 MG tablet  Commonly known as:  FLEXERIL  Take 10 mg by mouth 3 (three) times daily as needed for muscle spasms.     gabapentin 300 MG capsule  Commonly known as:  NEURONTIN  Take 300 mg by mouth 3 (three) times daily.     oxycodone 5 MG capsule  Commonly known as:  OXY-IR  Take 5 mg by mouth every 8 (eight) hours as needed for pain.         Signed: Karn Cassis 12/23/2012, 9:55 AM

## 2012-12-23 NOTE — Progress Notes (Signed)
Pt d/c instructions reviewed with pt, copy of instructions and scripts given to pt. Pt d/c'd via wheelchair with belongings, with family, escorted by hospital volunteer.

## 2013-01-28 ENCOUNTER — Inpatient Hospital Stay (HOSPITAL_COMMUNITY): Admission: AD | Admit: 2013-01-28 | Payer: Medicaid Other | Source: Ambulatory Visit | Admitting: Neurosurgery

## 2013-02-16 ENCOUNTER — Ambulatory Visit: Payer: Medicaid Other | Attending: Neurosurgery | Admitting: Physical Therapy

## 2013-02-16 DIAGNOSIS — R5381 Other malaise: Secondary | ICD-10-CM | POA: Insufficient documentation

## 2013-02-16 DIAGNOSIS — M545 Low back pain, unspecified: Secondary | ICD-10-CM | POA: Insufficient documentation

## 2013-02-16 DIAGNOSIS — IMO0001 Reserved for inherently not codable concepts without codable children: Secondary | ICD-10-CM | POA: Insufficient documentation

## 2013-11-30 ENCOUNTER — Telehealth: Payer: Self-pay | Admitting: Family Medicine

## 2013-11-30 NOTE — Telephone Encounter (Signed)
appt scheduled

## 2013-12-01 ENCOUNTER — Ambulatory Visit (INDEPENDENT_AMBULATORY_CARE_PROVIDER_SITE_OTHER): Payer: Medicaid Other | Admitting: Physician Assistant

## 2013-12-01 ENCOUNTER — Encounter: Payer: Self-pay | Admitting: Physician Assistant

## 2013-12-01 VITALS — BP 118/82 | HR 85 | Temp 97.5°F | Ht 67.0 in | Wt 232.5 lb

## 2013-12-01 DIAGNOSIS — M542 Cervicalgia: Secondary | ICD-10-CM

## 2013-12-01 DIAGNOSIS — M549 Dorsalgia, unspecified: Secondary | ICD-10-CM | POA: Insufficient documentation

## 2013-12-01 MED ORDER — CYCLOBENZAPRINE HCL 10 MG PO TABS: 10.0000 mg | ORAL_TABLET | Freq: Three times a day (TID) | ORAL | Status: DC | PRN

## 2013-12-01 MED ORDER — MELOXICAM 15 MG PO TABS: 15.0000 mg | ORAL_TABLET | Freq: Every day | ORAL | Status: DC

## 2013-12-01 NOTE — Progress Notes (Signed)
Subjective:     Patient ID: Toni FrameLisa Kim, female   DOB: 1976/01/02, 38 y.o.   MRN: [id number]  HPI Pt here for referral Pt states she has had neck pain since she was a child She called her Neuro Surg- Dr Jeral FruitBotero who told her to see her family MD No OTC meds tried for sx   Review of Systems Pain is to the post cerv region only Denies any radiation of pain or numbness to the upper ext Denies trauma to the neck    Objective:   Physical Exam NAD No ecchy/edema to the post cerv region No palp muscle spasm FROM of the C-spine w/o change in sx Shoulder shrug is equal Bicep/tricep/grip strength equal bilat Pulses/sensory good    Assessment:     Chronic neck pain    Plan:     Heat/Ice Massage Mobic 15mg  1 po qd #10 Flexeril 10mg  1 po tid #21- SE of meds reviewed MRI per pt request F/U pending study

## 2014-04-13 ENCOUNTER — Emergency Department (HOSPITAL_COMMUNITY)
Admission: EM | Admit: 2014-04-13 | Discharge: 2014-04-13 | Disposition: A | Discharge status: Home / Self Care | Payer: Medicaid Other | Attending: Emergency Medicine | Admitting: Emergency Medicine

## 2014-04-13 ENCOUNTER — Encounter (HOSPITAL_COMMUNITY): Payer: Self-pay | Admitting: Emergency Medicine

## 2014-04-13 DIAGNOSIS — G8929 Other chronic pain: Secondary | ICD-10-CM | POA: Diagnosis not present

## 2014-04-13 DIAGNOSIS — Z79899 Other long term (current) drug therapy: Secondary | ICD-10-CM | POA: Diagnosis not present

## 2014-04-13 DIAGNOSIS — M1990 Unspecified osteoarthritis, unspecified site: Secondary | ICD-10-CM | POA: Diagnosis not present

## 2014-04-13 DIAGNOSIS — Z791 Long term (current) use of non-steroidal anti-inflammatories (NSAID): Secondary | ICD-10-CM | POA: Diagnosis not present

## 2014-04-13 DIAGNOSIS — G8918 Other acute postprocedural pain: Secondary | ICD-10-CM | POA: Diagnosis not present

## 2014-04-13 DIAGNOSIS — M542 Cervicalgia: Secondary | ICD-10-CM | POA: Diagnosis present

## 2014-04-13 DIAGNOSIS — Z8719 Personal history of other diseases of the digestive system: Secondary | ICD-10-CM | POA: Insufficient documentation

## 2014-04-13 DIAGNOSIS — Z720 Tobacco use: Secondary | ICD-10-CM | POA: Diagnosis not present

## 2014-04-13 MED ORDER — DIAZEPAM 5 MG PO TABS
5.0000 mg | ORAL_TABLET | ORAL | Status: AC
  Administered 2014-04-13: 5 mg via ORAL
  Filled 2014-04-13: qty 1

## 2014-04-13 MED ORDER — KETOROLAC TROMETHAMINE 60 MG/2ML IM SOLN
60.0000 mg | Freq: Once | INTRAMUSCULAR | Status: AC
  Administered 2014-04-13: 60 mg via INTRAMUSCULAR
  Filled 2014-04-13: qty 2

## 2014-04-13 MED ORDER — CYCLOBENZAPRINE HCL 10 MG PO TABS: 10.0000 mg | ORAL_TABLET | Freq: Two times a day (BID) | ORAL | Status: DC | PRN

## 2014-04-13 NOTE — Discharge Instructions (Signed)
Please follow the directions provided. Be sure to follow-up with the orthopedic referral given to help manage this pain. He may take Tylenol every 4 hours or ibuprofen every 6-8 hours as needed for pain he may also use a muscle relaxant as needed to hesitate to return for any new worsening or concerning symptoms.  GET HELP RIGHT AWAY IF:  You are bleeding.  Your stomach is upset.  You have an allergic reaction to your medicine.  You develop new problems that you cannot explain.  You lose feeling (become numb) or you cannot move any part of your body (paralysis).  You have tingling or weakness in any part of your body.  Your symptoms get worse. Symptoms include:  Pain, soreness, stiffness, puffiness (swelling), or a burning feeling in your neck.  Pain when your neck is touched.  Shoulder or upper back pain.  Limited ability to move your neck.  Headache.  Dizziness.  Your hands or arms feel week, lose feeling, or tingle.  Muscle spasms.  Difficulty swallowing or chewing.

## 2014-04-13 NOTE — ED Provider Notes (Signed)
CSN: [id number]     Arrival date & time 04/13/14  1605 History   This chart was scribed for non-physician practitioner, Toni BattiestElizabeth Nirel Babler, NP, working with Linwood DibblesJon Knapp, MD, by Bronson CurbJacqueline Kim, ED Scribe. This patient was seen in room TR05C/TR05C and the patient's care was started at 5:08 PM.    Chief Complaint  Patient presents with  . Neck Pain    The history is provided by the patient. No language interpreter was used.    HPI Comments: Toni FrameLisa Kim is a 38 y.o. female who presents to the Emergency Department complaining of gradually worsening, sharp neck pain for the past 8 months. Patient notes pain since she had surgery on her back. She states the pain radiates down to her back, bilateral shoulders, arms, and to her hands. Patient was seen by Dr. Jeral FruitBotero (neurologist), but was told there was nothing that could be done without an MRI. She reports her insurance will not cover an MRI and has been unable to have this done, and decided to come here instead. Patient currently takes 10mg  Valium and 15 mg Roxycodone. She has been taking ibuprofen without significant improvement. She denies any new injuries or trauma.   Past Medical History  Diagnosis Date  . H/O hiatal hernia   . Arthritis    Past Surgical History  Procedure Laterality Date  . Cesarean section    . Lumbar laminectomy/decompression microdiscectomy Right 12/21/2012    Procedure: LUMBAR LAMINECTOMY/DECOMPRESSION MICRODISCECTOMY 2 level;  Surgeon: Toni CassisErnesto M Botero, MD;  Location: [facility];  Service: Neurosurgery;  Laterality: Right;  Right Lumbar three-four,Lumbar four-five Microdiskectomy  . Spine surgery     Family History  Problem Relation Age of Onset  . Heart disease Mother     MI   History  Substance Use Topics  . Smoking status: Current Every Day Smoker -- 1.00 packs/day for 10 years  . Smokeless tobacco: Not on file  . Alcohol Use: No   OB History   Grav Para Term Preterm Abortions TAB SAB Ect Mult Living                  Review of Systems  Constitutional: Negative for fever and chills.  Eyes: Negative for visual disturbance.  Musculoskeletal: Positive for back pain, myalgias (bilateral arms and hands) and neck pain.  Neurological: Negative for weakness and headaches.      Allergies  Review of patient's allergies indicates no known allergies.  Home Medications   Prior to Admission medications   Medication Sig Start Date End Date Taking? Authorizing Provider  cyclobenzaprine (FLEXERIL) 10 MG tablet Take 1 tablet (10 mg total) by mouth 3 (three) times daily as needed for muscle spasms. 12/01/13   Toni SizerWilliam L Webster, PA-C  diazepam (VALIUM) 10 MG tablet Take 10 mg by mouth every 6 (six) hours as needed for anxiety.    Toni CassisErnesto M Botero, MD  gabapentin (NEURONTIN) 300 MG capsule Take 300 mg by mouth 3 (three) times daily.    Historical Provider, MD  meloxicam (MOBIC) 15 MG tablet Take 1 tablet (15 mg total) by mouth daily. 12/01/13   Toni SizerWilliam L Webster, PA-C  oxycodone (OXY-IR) 5 MG capsule Take 5 mg by mouth every 8 (eight) hours as needed for pain.    Historical Provider, MD   Triage Vitals: BP 155/104  Pulse 85  Temp(Src) 98.5 F (36.9 C) (Oral)  Resp 20  SpO2 100%  Physical Exam  Nursing note and vitals reviewed. Constitutional: She is oriented to person,  place, and time. She appears well-developed and well-nourished. No distress.  HENT:  Head: Normocephalic and atraumatic.  Eyes: Conjunctivae and EOM are normal. Pupils are equal, round, and reactive to light.  Neck: Normal range of motion. Neck supple. Muscular tenderness present. No rigidity. No tracheal deviation present. No Brudzinski's sign and no Kernig's sign noted.    Cardiovascular: Normal rate.   Pulmonary/Chest: Effort normal. No respiratory distress.  Musculoskeletal: Normal range of motion.  Neurological: She is alert and oriented to person, place, and time. No cranial nerve deficit. Coordination normal.  Cranial  nerves intact II-XII. 2 point discrimination.  Skin: Skin is warm and dry.  Psychiatric: She has a normal mood and affect. Her behavior is normal.    ED Course  Procedures (including critical care time)  DIAGNOSTIC STUDIES: Oxygen Saturation is 100% on room air, normal by my interpretation.    COORDINATION OF CARE: At 1718 Discussed treatment plan with patient which includes Flexeril and orthopedic referrral. Patient agrees.   Labs Review Labs Reviewed - No data to display  Imaging Review No results found.   EKG Interpretation None      MDM   Final diagnoses:  Neck pain   38 yo female  with chronic neck pain, pt currently under the care of neurosurgery.  No neurological deficits and normal neuro exam.  Pt's neck is supple with normal ROM and she is afebrile with no meningeal signs. Pain managed in the ED.  Symptomatic mgmt and ortho referral provided with discharge instructions.  Pt would prefer if MRI and more aggressive neck pain mgmt could be provided from the ED but agreeable to current plan.  Return precautions provided.   I personally performed the services described in this documentation, which was scribed in my presence. The recorded information has been reviewed and is accurate.  Filed Vitals:   04/13/14 1624 04/13/14 1735  BP: 155/104 146/92  Pulse: 85 78  Temp: 98.5 F (36.9 C) 97.3 F (36.3 C)  TempSrc: Oral Oral  Resp: 20 16  SpO2: 100% 99%   Meds given in ED:  Medications  diazepam (VALIUM) tablet 5 mg (5 mg Oral Given 04/13/14 1733)  ketorolac (TORADOL) injection 60 mg (60 mg Intramuscular Given 04/13/14 1736)    Discharge Medication List as of 04/13/2014  5:26 PM    START taking these medications   Details  !! cyclobenzaprine (FLEXERIL) 10 MG tablet Take 1 tablet (10 mg total) by mouth 2 (two) times daily as needed for muscle spasms., Starting 04/13/2014, Until Discontinued, Print     !! - Potential duplicate medications found. Please discuss  with provider.        Toni BattiestElizabeth Toni Gates, NP 04/14/14 [phone]

## 2014-04-13 NOTE — ED Notes (Addendum)
Pt in c/o neck pain, states she was told 8 months ago that she had a pinched nerve, states the pain radiates down both shoulders and into her arms into her hands, seen at Dr Spero GeraldsBoteras office today and told to come here for evaluation

## 2014-04-19 ENCOUNTER — Telehealth: Payer: Self-pay | Admitting: Physician Assistant

## 2014-04-20 ENCOUNTER — Other Ambulatory Visit: Payer: Self-pay

## 2014-04-20 DIAGNOSIS — M542 Cervicalgia: Secondary | ICD-10-CM

## 2014-08-18 ENCOUNTER — Ambulatory Visit: Payer: Medicaid Other | Admitting: Family

## 2014-08-31 ENCOUNTER — Encounter: Payer: Self-pay | Admitting: Family

## 2014-08-31 ENCOUNTER — Ambulatory Visit (INDEPENDENT_AMBULATORY_CARE_PROVIDER_SITE_OTHER): Payer: Medicaid Other | Admitting: Family

## 2014-08-31 VITALS — BP 132/100 | HR 85 | Temp 97.0°F | Ht 67.0 in | Wt 227.0 lb

## 2014-08-31 DIAGNOSIS — L02419 Cutaneous abscess of limb, unspecified: Secondary | ICD-10-CM

## 2014-08-31 DIAGNOSIS — B3731 Acute candidiasis of vulva and vagina: Secondary | ICD-10-CM

## 2014-08-31 DIAGNOSIS — B372 Candidiasis of skin and nail: Secondary | ICD-10-CM

## 2014-08-31 DIAGNOSIS — B373 Candidiasis of vulva and vagina: Secondary | ICD-10-CM | POA: Diagnosis not present

## 2014-08-31 MED ORDER — NYSTATIN 100000 UNIT/GM EX POWD: CUTANEOUS | Status: DC

## 2014-08-31 MED ORDER — SULFAMETHOXAZOLE-TRIMETHOPRIM 800-160 MG PO TABS: 1.0000 | ORAL_TABLET | Freq: Two times a day (BID) | ORAL | Status: DC

## 2014-08-31 MED ORDER — NYSTATIN 100000 UNIT/GM EX CREA: 1.0000 "application " | TOPICAL_CREAM | Freq: Two times a day (BID) | CUTANEOUS | Status: DC

## 2014-08-31 MED ORDER — FLUCONAZOLE 150 MG PO TABS: 150.0000 mg | ORAL_TABLET | Freq: Once | ORAL | Status: DC

## 2014-08-31 NOTE — Patient Instructions (Addendum)
Candida Infection A Candida infection (also called yeast, fungus, and Monilia infection) is an overgrowth of yeast that can occur anywhere on the body. A yeast infection commonly occurs in warm, moist body areas. Usually, the infection remains localized but can spread to become a systemic infection. A yeast infection may be a sign of a more severe disease such as diabetes, leukemia, or AIDS. A yeast infection can occur in both men and women. In women, Candida vaginitis is a vaginal infection. It is one of the most common causes of vaginitis. Men usually do not have symptoms or know they have an infection until other problems develop. Men may find out they have a yeast infection because their sex partner has a yeast infection. Uncircumcised men are more likely to get a yeast infection than circumcised men. This is because the uncircumcised glans is not exposed to air and does not remain as dry as that of a circumcised glans. Older adults may develop yeast infections around dentures. CAUSES  Women  Antibiotics.  Steroid medication taken for a long time.  Being overweight (obese).  Diabetes.  Poor immune condition.  Certain serious medical conditions.  Immune suppressive medications for organ transplant patients.  Chemotherapy.  Pregnancy.  Menstruation.  Stress and fatigue.  Intravenous drug use.  Oral contraceptives.  Wearing tight-fitting clothes in the crotch area.  Catching it from a sex partner who has a yeast infection.  Spermicide.  Intravenous, urinary, or other catheters. Men  Catching it from a sex partner who has a yeast infection.  Having oral or anal sex with a person who has the infection.  Spermicide.  Diabetes.  Antibiotics.  Poor immune system.  Medications that suppress the immune system.  Intravenous drug use.  Intravenous, urinary, or other catheters. SYMPTOMS  Women  Thick, white vaginal discharge.  Vaginal itching.  Redness and  swelling in and around the vagina.  Irritation of the lips of the vagina and perineum.  Blisters on the vaginal lips and perineum.  Painful sexual intercourse.  Low blood sugar (hypoglycemia).  Painful urination.  Bladder infections.  Intestinal problems such as constipation, indigestion, bad breath, bloating, increase in gas, diarrhea, or loose stools. Men  Men may develop intestinal problems such as constipation, indigestion, bad breath, bloating, increase in gas, diarrhea, or loose stools.  Dry, cracked skin on the penis with itching or discomfort.  Jock itch.  Dry, flaky skin.  Athlete's foot.  Hypoglycemia. DIAGNOSIS  Women  A history and an exam are performed.  The discharge may be examined under a microscope.  A culture may be taken of the discharge. Men  A history and an exam are performed.  Any discharge from the penis or areas of cracked skin will be looked at under the microscope and cultured.  Stool samples may be cultured. TREATMENT  Women  Vaginal antifungal suppositories and creams.  Medicated creams to decrease irritation and itching on the outside of the vagina.  Warm compresses to the perineal area to decrease swelling and discomfort.  Oral antifungal medications.  Medicated vaginal suppositories or cream for repeated or recurrent infections.  Wash and dry the irritation areas before applying the cream.  Eating yogurt with Lactobacillus may help with prevention and treatment.  Sometimes painting the vagina with gentian violet solution may help if creams and suppositories do not work. Men  Antifungal creams and oral antifungal medications.  Sometimes treatment must continue for 30 days after the symptoms go away to prevent recurrence. HOME CARE INSTRUCTIONS    Women  Use cotton underwear and avoid tight-fitting clothing.  Avoid colored, scented toilet paper and deodorant tampons or pads.  Do not douche.  Keep your diabetes  under control.  Finish all the prescribed medications.  Keep your skin clean and dry.  Consume milk or yogurt with Lactobacillus-active culture regularly. If you get frequent yeast infections and think that is what the infection is, there are over-the-counter medications that you can get. If the infection does not show healing in 3 days, talk to your caregiver.  Tell your sex partner you have a yeast infection. Your partner may need treatment also, especially if your infection does not clear up or recurs. Men  Keep your skin clean and dry.  Keep your diabetes under control.  Finish all prescribed medications.  Tell your sex partner that you have a yeast infection so he or she can be treated if necessary. SEEK MEDICAL CARE IF:   Your symptoms do not clear up or worsen in one week after treatment.  You have an oral temperature above 102 F (38.9 C).  You have trouble swallowing or eating for a prolonged time.  You develop blisters on and around your vagina.  You develop vaginal bleeding and it is not your menstrual period.  You develop abdominal pain.  You develop intestinal problems as mentioned above.  You get weak or light-headed.  You have painful or increased urination.  You have pain during sexual intercourse. MAKE SURE YOU:   Understand these instructions.  Will watch your condition.  Will get help right away if you are not doing well or get worse. Document Released: 07/10/2004 Document Revised: 10/17/2013 Document Reviewed: 10/22/2009 [facility] Patient Information 2015 [place], [place]. This information is not intended to replace advice given to you by your health care provider. Make sure you discuss any questions you have with your health care provider. Abscess An abscess is an infected area that contains a collection of pus and debris.It can occur in almost any part of the body. An abscess is also known as a furuncle or boil. CAUSES  An abscess occurs when  tissue gets infected. This can occur from blockage of oil or sweat glands, infection of hair follicles, or a minor injury to the skin. As the body tries to fight the infection, pus collects in the area and creates pressure under the skin. This pressure causes pain. People with weakened immune systems have difficulty fighting infections and get certain abscesses more often.  SYMPTOMS Usually an abscess develops on the skin and becomes a painful mass that is red, warm, and tender. If the abscess forms under the skin, you may feel a moveable soft area under the skin. Some abscesses break open (rupture) on their own, but most will continue to get worse without care. The infection can spread deeper into the body and eventually into the bloodstream, causing you to feel ill.  DIAGNOSIS  Your caregiver will take your medical history and perform a physical exam. A sample of fluid may also be taken from the abscess to determine what is causing your infection. TREATMENT  Your caregiver may prescribe antibiotic medicines to fight the infection. However, taking antibiotics alone usually does not cure an abscess. Your caregiver may need to make a small cut (incision) in the abscess to drain the pus. In some cases, gauze is packed into the abscess to reduce pain and to continue draining the area. HOME CARE INSTRUCTIONS   Only take over-the-counter or prescription medicines for pain, discomfort,  or fever as directed by your caregiver.  If you were prescribed antibiotics, take them as directed. Finish them even if you start to feel better.  If gauze is used, follow your caregiver's directions for changing the gauze.  To avoid spreading the infection:  Keep your draining abscess covered with a bandage.  Wash your hands well.  Do not share personal care items, towels, or whirlpools with others.  Avoid skin contact with others.  Keep your skin and clothes clean around the abscess.  Keep all follow-up  appointments as directed by your caregiver. SEEK MEDICAL CARE IF:   You have increased pain, swelling, redness, fluid drainage, or bleeding.  You have muscle aches, chills, or a general ill feeling.  You have a fever. MAKE SURE YOU:   Understand these instructions.  Will watch your condition.  Will get help right away if you are not doing well or get worse. Document Released: 03/12/2005 Document Revised: 12/02/2011 Document Reviewed: 08/15/2011 [facility] Patient Information 2015 [place], [place]. This information is not intended to replace advice given to you by your health care provider. Make sure you discuss any questions you have with your health care provider. Chlorhexidine topical antiseptic What is this medicine? CHLORHEXIDINE (klor HEX i deen) is used as a skin wound cleanser and a general skin cleanser. This medicine is also used as a surgical hand scrub and to cleanse the skin before surgery to help prevent infections. This medicine may be used for other purposes; ask your health care provider or pharmacist if you have questions. COMMON BRAND NAME(S): Betasept, Chlorostat, Hibiclens What should I tell my health care provider before I take this medicine? They need to know if you have any of the following conditions: -any skin rashes or problems -an unusual or allergic reaction to chlorhexidine, other medicines, foods, dyes, or preservatives -pregnant or trying to get pregnant -breast-feeding How should I use this medicine? This medicine is for external use only. Do not take by mouth. Follow the directions on the label or those given to you by your doctor or health care professional. Keep out of eyes, ears and mouth. This medicine should not be used as a preoperative skin preparation of the face or head. Talk to your pediatrician regarding the use of this medicine in children. While this medicine may be used for children for selected conditions, precautions do apply. Overdosage:  If you think you have taken too much of this medicine contact a poison control center or emergency room at once. NOTE: This medicine is only for you. Do not share this medicine with others. What if I miss a dose? This does not apply; this medicine is not for regular use. What may interact with this medicine? Interactions are not expected. This list may not describe all possible interactions. Give your health care provider a list of all the medicines, herbs, non-prescription drugs, or dietary supplements you use. Also tell them if you smoke, drink alcohol, or use illegal drugs. Some items may interact with your medicine. What should I watch for while using this medicine? This medicine may cause severe allergic reactions. Notify a health care professional right away if you think you are having an allergic reaction. Do not take this medicine by mouth. Avoid contact with your ears and eyes. If contact with the eyes occur, rinse the eyes well with plenty of cool tap water. What side effects may I notice from receiving this medicine? Side effects that you should report to your doctor or other  health care professional as soon as possible: -allergic reactions like skin rash, itching or hives, swelling of the face, lips, or tongue -breathing problems -cough Side effects that usually do not require medical attention (report to your doctor or other health care professional if they continue or are bothersome): -increased sensitivity to the sun -skin irritation This list may not describe all possible side effects. Call your doctor for medical advice about side effects. You may report side effects to FDA at 1-800-FDA-1088. Where should I keep my medicine? Keep out of the reach of children. Store at room temperature between 15 and 30 degrees C (59 and 86 degrees F). Store away from direct light and heat. Do not freeze. Throw away any unused medicine after the expiration date. NOTE: This sheet is a summary. It  may not cover all possible information. If you have questions about this medicine, talk to your doctor, pharmacist, or health care provider.  2015, [employer]. (2007-09-08 16:55:34)

## 2014-08-31 NOTE — Progress Notes (Signed)
   Subjective:    Patient ID: Toni FrameLisa Ohaver, female    DOB: Aug 21, 1975, 39 y.o.   MRN: [id number]  HPI Pt presents to the office today for scattered abscess under her arms on both signs and under her breasts. PT states this has been going on for 2-3 months. Pt states these bumps itch and oozing blood and puss. Pt states she has used benadryl cream on these areas without relief.    Review of Systems  HENT: Negative.   Eyes: Negative.   Respiratory: Negative.  Negative for shortness of breath.   Cardiovascular: Negative.  Negative for palpitations.  Gastrointestinal: Negative.   Endocrine: Negative.   Genitourinary: Negative.   Musculoskeletal: Positive for neck pain.  Neurological: Negative.  Negative for headaches.  Hematological: Negative.   Psychiatric/Behavioral: Negative.   All other systems reviewed and are negative.      Objective:   Physical Exam  Constitutional: She is oriented to person, place, and time. She appears well-developed and well-nourished. No distress.  HENT:  Head: Normocephalic and atraumatic.  Right Ear: External ear normal.  Left Ear: External ear normal.  Nose: Nose normal.  Mouth/Throat: Oropharynx is clear and moist.  Eyes: Pupils are equal, round, and reactive to light.  Neck: Normal range of motion. Neck supple. No thyromegaly present.  Cardiovascular: Normal rate, regular rhythm, normal heart sounds and intact distal pulses.   No murmur heard. Pulmonary/Chest: Effort normal and breath sounds normal. No respiratory distress. She has no wheezes.  Abdominal: Soft. Bowel sounds are normal. She exhibits no distension. There is no tenderness.  Musculoskeletal: Normal range of motion. She exhibits no edema or tenderness.  Neurological: She is alert and oriented to person, place, and time. She has normal reflexes. No cranial nerve deficit.  Skin: Skin is warm and dry. Rash (erytheamas generaliazed rash under right breast) noted.  Scattered abscess under  bilateral axillary   Psychiatric: She has a normal mood and affect. Her behavior is normal. Judgment and thought content normal.  Vitals reviewed.   Blood pressure 132/100, pulse 85, temperature 97 F (36.1 C), temperature source Oral, height 5\' 7"  (1.702 m), weight 227 lb (102.967 kg).       Assessment & Plan:  1. Abscess of axilla -Keep clean and dry -Do not pick at areas -Warm compresses as needed - sulfamethoxazole-trimethoprim (BACTRIM DS,SEPTRA DS) 800-160 MG per tablet; Take 1 tablet by mouth 2 (two) times daily.  Dispense: 14 tablet; Refill: 0  2. Yeast infection of the skin -Keep clean and dry - nystatin (MYCOSTATIN/NYSTOP) 100000 UNIT/GM POWD; Apply under breast TID prn  Dispense: 30 g; Refill: 3 - nystatin cream (MYCOSTATIN); Apply 1 application topically 2 (two) times daily.  Dispense: 30 g; Refill: 0  3. Vaginal yeast infection -Cotton underwear - fluconazole (DIFLUCAN) 150 MG tablet; Take 1 tablet (150 mg total) by mouth once.  Dispense: 1 tablet; Refill: 0  Pt to make another appointment to discuss neck pain RTO prn  Jannifer Rodneyhristy Hawks, FNP

## 2014-09-18 ENCOUNTER — Ambulatory Visit (INDEPENDENT_AMBULATORY_CARE_PROVIDER_SITE_OTHER): Payer: Medicaid Other

## 2014-09-18 ENCOUNTER — Ambulatory Visit (INDEPENDENT_AMBULATORY_CARE_PROVIDER_SITE_OTHER): Payer: Medicaid Other | Admitting: Family

## 2014-09-18 ENCOUNTER — Encounter: Payer: Self-pay | Admitting: Family

## 2014-09-18 VITALS — BP 121/83 | HR 77 | Temp 97.1°F | Ht 67.0 in | Wt 228.4 lb

## 2014-09-18 DIAGNOSIS — G8929 Other chronic pain: Secondary | ICD-10-CM

## 2014-09-18 DIAGNOSIS — M5412 Radiculopathy, cervical region: Secondary | ICD-10-CM | POA: Diagnosis not present

## 2014-09-18 DIAGNOSIS — M542 Cervicalgia: Secondary | ICD-10-CM

## 2014-09-18 MED ORDER — MELOXICAM 15 MG PO TABS: 15.0000 mg | ORAL_TABLET | Freq: Every day | ORAL | Status: DC

## 2014-09-18 MED ORDER — KETOROLAC TROMETHAMINE 60 MG/2ML IM SOLN
60.0000 mg | Freq: Once | INTRAMUSCULAR | Status: AC
  Administered 2014-09-18: 60 mg via INTRAMUSCULAR

## 2014-09-18 MED ORDER — METHYLPREDNISOLONE (PAK) 4 MG PO TABS: ORAL_TABLET | ORAL | Status: DC

## 2014-09-18 NOTE — Patient Instructions (Addendum)
Cervical Radiculopathy Cervical radiculopathy happens when a nerve in the neck is pinched or bruised by a slipped (herniated) disk or by arthritic changes in the bones of the cervical spine. This can occur due to an injury or as part of the normal aging process. Pressure on the cervical nerves can cause pain or numbness that runs from your neck all the way down into your arm and fingers. CAUSES  There are many possible causes, including:  Injury.  Muscle tightness in the neck from overuse.  Swollen, painful joints (arthritis).  Breakdown or degeneration in the bones and joints of the spine (spondylosis) due to aging.  Bone spurs that may develop near the cervical nerves. SYMPTOMS  Symptoms include pain, weakness, or numbness in the affected arm and hand. Pain can be severe or irritating. Symptoms may be worse when extending or turning the neck. DIAGNOSIS  Your caregiver will ask about your symptoms and do a physical exam. He or she may test your strength and reflexes. X-rays, CT scans, and MRI scans may be needed in cases of injury or if the symptoms do not go away after a period of time. Electromyography (EMG) or nerve conduction testing may be done to study how your nerves and muscles are working. TREATMENT  Your caregiver may recommend certain exercises to help relieve your symptoms. Cervical radiculopathy can, and often does, get better with time and treatment. If your problems continue, treatment options may include:  Wearing a soft collar for short periods of time.  Physical therapy to strengthen the neck muscles.  Medicines, such as nonsteroidal anti-inflammatory drugs (NSAIDs), oral corticosteroids, or spinal injections.  Surgery. Different types of surgery may be done depending on the cause of your problems. HOME CARE INSTRUCTIONS   Put ice on the affected area.  Put ice in a plastic bag.  Place a towel between your skin and the bag.  Leave the ice on for 15-20 minutes,  03-04 times a day or as directed by your caregiver.  If ice does not help, you can try using heat. Take a warm shower or bath, or use a hot water bottle as directed by your caregiver.  You may try a gentle neck and shoulder massage.  Use a flat pillow when you sleep.  Only take over-the-counter or prescription medicines for pain, discomfort, or fever as directed by your caregiver.  If physical therapy was prescribed, follow your caregiver's directions.  If a soft collar was prescribed, use it as directed. SEEK IMMEDIATE MEDICAL CARE IF:   Your pain gets much worse and cannot be controlled with medicines.  You have weakness or numbness in your hand, arm, face, or leg.  You have a high fever or a stiff, rigid neck.  You lose bowel or bladder control (incontinence).  You have trouble with walking, balance, or speaking. MAKE SURE YOU:   Understand these instructions.  Will watch your condition.  Will get help right away if you are not doing well or get worse. Document Released: 02/25/2001 Document Revised: 08/25/2011 Document Reviewed: 01/14/2011 [facility] Patient Information 2015 [place]. This information is not intended to replace advice given to you by your health care provider. Make sure you discuss any questions you have with your health care provider. Degenerative Disk Disease Degenerative disk disease is a condition caused by the changes that occur in the cushions of the backbone (spinal disks) as you grow older. Spinal disks are soft and compressible disks located between the bones of the spine (vertebrae).  They act like shock absorbers. Degenerative disk disease can affect the whole spine. However, the neck and lower back are most commonly affected. Many changes can occur in the spinal disks with aging, such as:  The spinal disks may dry and shrink.  Small tears may occur in the tough, outer covering of the disk (annulus).  The disk space may become smaller due to  loss of water.  Abnormal growths in the bone (spurs) may occur. This can put pressure on the nerve roots exiting the spinal canal, causing pain.  The spinal canal may become narrowed. CAUSES  Degenerative disk disease is a condition caused by the changes that occur in the spinal disks with aging. The exact cause is not known, but there is a genetic basis for many patients. Degenerative changes can occur due to loss of fluid in the disk. This makes the disk thinner and reduces the space between the backbones. Small cracks can develop in the outer layer of the disk. This can lead to the breakdown of the disk. You are more likely to get degenerative disk disease if you are overweight. Smoking cigarettes and doing heavy work such as weightlifting can also increase your risk of this condition. Degenerative changes can start after a sudden injury. Growth of bone spurs can compress the nerve roots and cause pain.  SYMPTOMS  The symptoms vary from person to person. Some people may have no pain, while others have severe pain. The pain may be so severe that it can limit your activities. The location of the pain depends on the part of your backbone that is affected. You will have neck or arm pain if a disk in the neck area is affected. You will have pain in your back, buttocks, or legs if a disk in the lower back is affected. The pain becomes worse while bending, reaching up, or with twisting movements. The pain may start gradually and then get worse as time passes. It may also start after a major or minor injury. You may feel numbness or tingling in the arms or legs.  DIAGNOSIS  Your caregiver will ask you about your symptoms and about activities or habits that may cause the pain. He or she may also ask about any injuries, diseases, or treatments you have had earlier. Your caregiver will examine you to check for the range of movement that is possible in the affected area, to check for strength in your extremities,  and to check for sensation in the areas of the arms and legs supplied by different nerve roots. An X-ray of the spine may be taken. Your caregiver may suggest other imaging tests, such as magnetic resonance imaging (MRI), if needed.  TREATMENT  Treatment includes rest, modifying your activities, and applying ice and heat. Your caregiver may prescribe medicines to reduce your pain and may ask you to do some exercises to strengthen your back. In some cases, you may need surgery. You and your caregiver will decide on the treatment that is best for you. HOME CARE INSTRUCTIONS   Follow proper lifting and walking techniques as advised by your caregiver.  Maintain good posture.  Exercise regularly as advised.  Perform relaxation exercises.  Change your sitting, standing, and sleeping habits as advised. Change positions frequently.  Lose weight as advised.  Stop smoking if you smoke.  Wear supportive footwear. SEEK MEDICAL CARE IF:  Your pain does not go away within 1 to 4 weeks. SEEK IMMEDIATE MEDICAL CARE IF:   Your pain  is severe.  You notice weakness in your arms, hands, or legs.  You begin to lose control of your bladder or bowel movements. MAKE SURE YOU:   Understand these instructions.  Will watch your condition.  Will get help right away if you are not doing well or get worse. Document Released: 03/30/2007 Document Revised: 08/25/2011 Document Reviewed: 10/04/2013 [facility] Patient Information 2015 [place], [place]. This information is not intended to replace advice given to you by your health care provider. Make sure you discuss any questions you have with your health care provider.

## 2014-09-18 NOTE — Progress Notes (Signed)
   Subjective:    Patient ID: Toni Kim, female    DOB: February 14, 1976, 39 y.o.   MRN: [id number]  Neck Pain  This is a chronic problem. The current episode started more than 1 year ago. The problem occurs constantly. The problem has been unchanged. The pain is associated with nothing. The pain is present in the right side and left side. The quality of the pain is described as aching. The pain is at a severity of 9/10. The pain is moderate. The symptoms are aggravated by position and twisting. Associated symptoms include numbness (in fingers) and tingling (in fingers). Pertinent negatives include no headaches. She has tried nothing for the symptoms. The treatment provided mild relief.   *   Review of Systems  Constitutional: Negative.   HENT: Negative.   Eyes: Negative.   Respiratory: Negative.  Negative for shortness of breath.   Cardiovascular: Negative.  Negative for palpitations.  Gastrointestinal: Negative.   Endocrine: Negative.   Genitourinary: Negative.   Musculoskeletal: Positive for neck pain.  Neurological: Positive for tingling (in fingers) and numbness (in fingers). Negative for headaches.  Hematological: Negative.   Psychiatric/Behavioral: Negative.   All other systems reviewed and are negative.      Objective:   Physical Exam  Constitutional: She is oriented to person, place, and time. She appears well-developed and well-nourished. No distress.  HENT:  Head: Normocephalic and atraumatic.  Right Ear: External ear normal.  Left Ear: External ear normal.  Nose: Nose normal.  Mouth/Throat: Oropharynx is clear and moist.  Eyes: Pupils are equal, round, and reactive to light.  Neck: Normal range of motion. Neck supple. No thyromegaly present.  Cardiovascular: Normal rate, regular rhythm, normal heart sounds and intact distal pulses.   No murmur heard. Pulmonary/Chest: Effort normal and breath sounds normal. No respiratory distress. She has no wheezes.  Abdominal: Soft.  Bowel sounds are normal. She exhibits no distension. There is no tenderness.  Musculoskeletal: Normal range of motion. She exhibits no edema or tenderness.  Neurological: She is alert and oriented to person, place, and time. She has normal reflexes. No cranial nerve deficit.  Skin: Skin is warm and dry.  Psychiatric: She has a normal mood and affect. Her behavior is normal. Judgment and thought content normal.  Vitals reviewed.  BP 121/83 mmHg  Pulse 77  Temp(Src) 97.1 F (36.2 C) (Oral)  Ht $R'5\' 7"'tQ$  (1.702 m)  Wt 228 lb 6.4 oz (103.602 kg)  BMI 35.76 kg/m2   Cervical X-ray- Degenerative disc changes present- Preliminary reading by Evelina Dun, FNP [facility]     Assessment & Plan:  1. Neck pain, chronic - DG Cervical Spine Complete; Future - CMP14+EGFR - MR Cervical Spine W Wo Contrast; Future  2. Cervical radiculitis -Rest -Ice as needed RTO in 2 weeks to recheck - ketorolac (TORADOL) injection 60 mg; Inject 2 mLs (60 mg total) into the muscle once. - meloxicam (MOBIC) 15 MG tablet; Take 1 tablet (15 mg total) by mouth daily.  Dispense: 10 tablet; Refill: 0 - methylPREDNIsolone (MEDROL DOSPACK) 4 MG tablet; follow package directions  Dispense: 21 tablet; Refill: 0 - DG Cervical Spine Complete; Future - [place], FNP

## 2014-09-19 LAB — CMP14+EGFR
ALBUMIN: 4 g/dL (ref 3.5–5.5)
ALT: 18 IU/L (ref 0–32)
AST: 23 IU/L (ref 0–40)
Albumin/Globulin Ratio: 1.3 (ref 1.1–2.5)
Alkaline Phosphatase: 131 IU/L — ABNORMAL HIGH (ref 39–117)
BUN/Creatinine Ratio: 7 — ABNORMAL LOW (ref 8–20)
BUN: 9 mg/dL (ref 6–20)
Bilirubin Total: <0.2 mg/dL (ref 0.0–1.2)
CHLORIDE: 105 mmol/L (ref 97–108)
CO2: 23 mmol/L (ref 18–29)
CREATININE: 1.21 mg/dL — AB (ref 0.57–1.00)
Calcium: 9 mg/dL (ref 8.7–10.2)
GFR calc non Af Amer: 57 mL/min/{1.73_m2} — ABNORMAL LOW (ref >59)
GFR, EST AFRICAN AMERICAN: 66 mL/min/{1.73_m2} (ref >59)
GLOBULIN, TOTAL: 3.2 g/dL (ref 1.5–4.5)
GLUCOSE: 86 mg/dL (ref 65–99)
Potassium: 4.1 mmol/L (ref 3.5–5.2)
Sodium: 141 mmol/L (ref 134–144)
TOTAL PROTEIN: 7.2 g/dL (ref 6.0–8.5)

## 2014-09-21 ENCOUNTER — Telehealth: Payer: Self-pay | Admitting: *Deleted

## 2014-09-21 NOTE — Telephone Encounter (Signed)
-----   Message from Junie Spencerhristy A Hawks, FNP sent at 09/20/2014 11:15 AM EDT ----- Kidney and liver function stable

## 2014-09-21 NOTE — Telephone Encounter (Signed)
Pt notified of results Verbalizes understanding 

## 2014-10-03 ENCOUNTER — Ambulatory Visit: Payer: Medicaid Other | Admitting: Family

## 2014-10-06 ENCOUNTER — Ambulatory Visit: Payer: Medicaid Other | Admitting: Family

## 2014-10-16 ENCOUNTER — Ambulatory Visit: Payer: Medicaid Other | Admitting: Family

## 2015-04-24 ENCOUNTER — Encounter: Payer: Self-pay | Admitting: Family

## 2015-04-24 ENCOUNTER — Ambulatory Visit: Payer: Medicaid Other | Admitting: Family

## 2015-04-24 ENCOUNTER — Ambulatory Visit (INDEPENDENT_AMBULATORY_CARE_PROVIDER_SITE_OTHER): Payer: Medicaid Other | Admitting: Family

## 2015-04-24 VITALS — BP 140/102 | HR 84 | Temp 97.2°F | Ht 67.0 in | Wt 242.0 lb

## 2015-04-24 DIAGNOSIS — N760 Acute vaginitis: Secondary | ICD-10-CM | POA: Diagnosis not present

## 2015-04-24 DIAGNOSIS — B9689 Other specified bacterial agents as the cause of diseases classified elsewhere: Secondary | ICD-10-CM

## 2015-04-24 DIAGNOSIS — A499 Bacterial infection, unspecified: Secondary | ICD-10-CM

## 2015-04-24 DIAGNOSIS — L0291 Cutaneous abscess, unspecified: Secondary | ICD-10-CM

## 2015-04-24 DIAGNOSIS — J0101 Acute recurrent maxillary sinusitis: Secondary | ICD-10-CM | POA: Diagnosis not present

## 2015-04-24 DIAGNOSIS — I10 Essential (primary) hypertension: Secondary | ICD-10-CM | POA: Diagnosis not present

## 2015-04-24 DIAGNOSIS — K219 Gastro-esophageal reflux disease without esophagitis: Secondary | ICD-10-CM | POA: Diagnosis not present

## 2015-04-24 DIAGNOSIS — L299 Pruritus, unspecified: Secondary | ICD-10-CM | POA: Diagnosis not present

## 2015-04-24 LAB — POCT WET PREP (WET MOUNT)

## 2015-04-24 MED ORDER — OMEPRAZOLE 20 MG PO CPDR: 20.0000 mg | DELAYED_RELEASE_CAPSULE | Freq: Every day | ORAL | Status: DC

## 2015-04-24 MED ORDER — METRONIDAZOLE 500 MG PO TABS: 500.0000 mg | ORAL_TABLET | Freq: Two times a day (BID) | ORAL | Status: DC

## 2015-04-24 MED ORDER — AMOXICILLIN-POT CLAVULANATE 875-125 MG PO TABS: 1.0000 | ORAL_TABLET | Freq: Two times a day (BID) | ORAL | Status: DC

## 2015-04-24 NOTE — Progress Notes (Addendum)
Subjective:    Patient ID: Toni Kim, female    DOB: April 30, 1976, 39 y.o.   MRN: [id number]  Pt presents to the office today with several complaints. Pt states she is having sinus problems, vaginal itching, GERD, and generalized itching.  Gastroesophageal Reflux She complains of coughing, heartburn, a hoarse voice, nausea and wheezing. She reports no choking or no sore throat. This is a chronic problem. The current episode started more than 1 year ago. The problem occurs constantly. The problem has been waxing and waning. The heartburn duration is several minutes. The heartburn does not wake her from sleep. The heartburn does not limit her activity. The symptoms are aggravated by certain foods and lying down. Risk factors include obesity. She has tried an antacid for the symptoms. The treatment provided mild relief.  Sinus Problem This is a new problem. The current episode started in the past 7 days. The problem is unchanged. There has been no fever. Her pain is at a severity of 8/10. The pain is moderate. Associated symptoms include chills, coughing, headaches, a hoarse voice, sinus pressure and sneezing. Pertinent negatives include no ear pain, shortness of breath or sore throat. Past treatments include oral decongestants. The treatment provided mild relief.  Vaginal Itching The patient's primary symptoms include genital itching and vaginal discharge. The patient's pertinent negatives include no genital odor. This is a new problem. The current episode started in the past 7 days. The problem occurs intermittently. The problem has been unchanged. The patient is experiencing no pain. Associated symptoms include chills, headaches and nausea. Pertinent negatives include no discolored urine or sore throat. The vaginal discharge was white.      Review of Systems  Constitutional: Positive for chills.  HENT: Positive for hoarse voice, sinus pressure and sneezing. Negative for ear pain and sore throat.    Eyes: Negative.   Respiratory: Positive for cough and wheezing. Negative for choking and shortness of breath.   Cardiovascular: Negative.  Negative for palpitations.  Gastrointestinal: Positive for heartburn and nausea.  Endocrine: Negative.   Genitourinary: Positive for vaginal discharge.  Musculoskeletal: Negative.   Neurological: Positive for headaches.  Hematological: Negative.   Psychiatric/Behavioral: Negative.   All other systems reviewed and are negative.      Objective:   Physical Exam  Constitutional: She is oriented to person, place, and time. She appears well-developed and well-nourished. No distress.  HENT:  Head: Normocephalic and atraumatic.  Right Ear: External ear normal.  Left Ear: External ear normal.  Nose: Right sinus exhibits maxillary sinus tenderness. Left sinus exhibits maxillary sinus tenderness.  Nasal passage erythemas with mild swelling  Oropharynx erythemas   Eyes: Pupils are equal, round, and reactive to light.  Neck: Normal range of motion. Neck supple. No thyromegaly present.  Cardiovascular: Normal rate, regular rhythm, normal heart sounds and intact distal pulses.   No murmur heard. Pulmonary/Chest: Effort normal and breath sounds normal. No respiratory distress. She has no wheezes.  Abdominal: Soft. Bowel sounds are normal. She exhibits no distension. There is no tenderness.  Musculoskeletal: Normal range of motion. She exhibits no edema or tenderness.  Neurological: She is alert and oriented to person, place, and time. She has normal reflexes. No cranial nerve deficit.  Skin: Skin is warm and dry.  Small healing abscess on left lower abd and right lower leg. Scab present with no drainage or redness     Psychiatric: She has a normal mood and affect. Her behavior is normal. Judgment and thought content  normal.  Vitals reviewed.     BP 140/102 mmHg  Pulse 84  Temp(Src) 97.2 F (36.2 C) (Oral)  Ht  (1.702 m)  Wt 242 lb (109.77  kg)  BMI 37.89 kg/m2     Assessment & Plan:  1. Itching - POCT Wet Prep [facility])  2. Acute recurrent maxillary sinusitis -- Take meds as prescribed - Use a cool mist humidifier  -Use saline nose sprays frequently -Saline irrigations of the nose can be very helpful if done frequently.  * 4X daily for 1 week*  * Use of a nettie pot can be helpful with this. Follow directions with this* -Force fluids -For any cough or congestion  Use plain Mucinex- regular strength or max strength is fine -No decongestant related to hypertension!! -For fever or aces or pains- take tylenol or ibuprofen appropriate for age and weight.  * for fevers greater than 101 orally you may alternate ibuprofen and tylenol every  3 hours. -Throat lozenges if help - amoxicillin-clavulanate (AUGMENTIN) 875-125 MG tablet; Take 1 tablet by mouth 2 (two) times daily.  Dispense: 14 tablet; Refill: 0  3. Abscess -Do not pick or squeeze -Warm compresses as needed - amoxicillin-clavulanate (AUGMENTIN) 875-125 MG tablet; Take 1 tablet by mouth 2 (two) times daily.  Dispense: 14 tablet; Refill: 0  4. BV (bacterial vaginosis) -Keep clean and dry - metroNIDAZOLE (FLAGYL) 500 MG tablet; Take 1 tablet (500 mg total) by mouth 2 (two) times daily.  Dispense: 14 tablet; Refill: 0  5. Essential hypertension No decongestants!!! RTO in 2 week to recheck BP   6. Gastroesophageal reflux disease, esophagitis presence not specified - omeprazole (PRILOSEC) 20 MG capsule; Take 1 capsule (20 mg total) by mouth daily.  Dispense: 90 capsule; Refill: 1   Jannifer Rodney, FNP

## 2015-04-24 NOTE — Patient Instructions (Signed)

## 2015-04-24 NOTE — Addendum Note (Signed)
Addended by: Jannifer RodneyHAWKS, Amariyana Heacox A on: 04/24/2015 12:02 PM   Modules accepted: Orders

## 2015-04-30 ENCOUNTER — Telehealth: Payer: Self-pay | Admitting: Family

## 2015-04-30 MED ORDER — FLUCONAZOLE 150 MG PO TABS: 150.0000 mg | ORAL_TABLET | Freq: Once | ORAL | Status: DC

## 2015-04-30 NOTE — Telephone Encounter (Signed)
Diflucan Prescription sent to pharmacy   

## 2015-05-07 ENCOUNTER — Other Ambulatory Visit: Payer: Self-pay | Admitting: Family

## 2015-05-07 MED ORDER — BUTOCONAZOLE NITRATE (1 DOSE) 2 % VA CREA: TOPICAL_CREAM | VAGINAL | Status: DC

## 2015-05-07 MED ORDER — FLUCONAZOLE 150 MG PO TABS: 150.0000 mg | ORAL_TABLET | ORAL | Status: DC

## 2015-05-07 NOTE — Telephone Encounter (Signed)
2 tabs of Diflucan Prescription sent to pharmacy. Pt to take one today and 3 days later if still having symptoms take other dose. Also sent vagina cream to use for 3 nights.

## 2015-05-08 ENCOUNTER — Ambulatory Visit (INDEPENDENT_AMBULATORY_CARE_PROVIDER_SITE_OTHER): Payer: Medicaid Other | Admitting: Family

## 2015-05-08 ENCOUNTER — Encounter: Payer: Self-pay | Admitting: Family

## 2015-05-08 VITALS — BP 144/93 | HR 82 | Temp 96.7°F | Ht 67.0 in | Wt 244.0 lb

## 2015-05-08 DIAGNOSIS — I10 Essential (primary) hypertension: Secondary | ICD-10-CM | POA: Diagnosis not present

## 2015-05-08 MED ORDER — HYDROCHLOROTHIAZIDE 12.5 MG PO TABS: 12.5000 mg | ORAL_TABLET | Freq: Every day | ORAL | Status: DC

## 2015-05-08 NOTE — Patient Instructions (Signed)
DASH Eating Plan °DASH stands for "Dietary Approaches to Stop Hypertension." The DASH eating plan is a healthy eating plan that has been shown to reduce high blood pressure (hypertension). Additional health benefits may include reducing the risk of type 2 diabetes mellitus, heart disease, and stroke. The DASH eating plan may also help with weight loss. °WHAT DO I NEED TO KNOW ABOUT THE DASH EATING PLAN? °For the DASH eating plan, you will follow these general guidelines: °· Choose foods with a percent daily value for sodium of less than 5% (as listed on the food label). °· Use salt-free seasonings or herbs instead of table salt or sea salt. °· Check with your health care provider or pharmacist before using salt substitutes. °· Eat lower-sodium products, often labeled as "lower sodium" or "no salt added." °· Eat fresh foods. °· Eat more vegetables, fruits, and low-fat dairy products. °· Choose whole grains. Look for the word "whole" as the first word in the ingredient list. °· Choose fish and skinless chicken or turkey more often than red meat. Limit fish, poultry, and meat to 6 oz (170 g) each day. °· Limit sweets, desserts, sugars, and sugary drinks. °· Choose heart-healthy fats. °· Limit cheese to 1 oz (28 g) per day. °· Eat more home-cooked food and less restaurant, buffet, and fast food. °· Limit fried foods. °· Cook foods using methods other than frying. °· Limit canned vegetables. If you do use them, rinse them well to decrease the sodium. °· When eating at a restaurant, ask that your food be prepared with less salt, or no salt if possible. °WHAT FOODS CAN I EAT? °Seek help from a dietitian for individual calorie needs. °Grains °Whole grain or whole wheat bread. Brown rice. Whole grain or whole wheat pasta. Quinoa, bulgur, and whole grain cereals. Low-sodium cereals. Corn or whole wheat flour tortillas. Whole grain cornbread. Whole grain crackers. Low-sodium crackers. °Vegetables °Fresh or frozen vegetables  (raw, steamed, roasted, or grilled). Low-sodium or reduced-sodium tomato and vegetable juices. Low-sodium or reduced-sodium tomato sauce and paste. Low-sodium or reduced-sodium canned vegetables.  °Fruits °All fresh, canned (in natural juice), or frozen fruits. °Meat and Other Protein Products °Ground beef (85% or leaner), grass-fed beef, or beef trimmed of fat. Skinless chicken or turkey. Ground chicken or turkey. Pork trimmed of fat. All fish and seafood. Eggs. Dried beans, peas, or lentils. Unsalted nuts and seeds. Unsalted canned beans. °Dairy °Low-fat dairy products, such as skim or 1% milk, 2% or reduced-fat cheeses, low-fat ricotta or cottage cheese, or plain low-fat yogurt. Low-sodium or reduced-sodium cheeses. °Fats and Oils °Tub margarines without trans fats. Light or reduced-fat mayonnaise and salad dressings (reduced sodium). Avocado. Safflower, olive, or canola oils. Natural peanut or almond butter. °Other °Unsalted popcorn and pretzels. °The items listed above may not be a complete list of recommended foods or beverages. Contact your dietitian for more options. °WHAT FOODS ARE NOT RECOMMENDED? °Grains °White bread. White pasta. White rice. Refined cornbread. Bagels and croissants. Crackers that contain trans fat. °Vegetables °Creamed or fried vegetables. Vegetables in a cheese sauce. Regular canned vegetables. Regular canned tomato sauce and paste. Regular tomato and vegetable juices. °Fruits °Dried fruits. Canned fruit in light or heavy syrup. Fruit juice. °Meat and Other Protein Products °Fatty cuts of meat. Ribs, chicken wings, bacon, sausage, bologna, salami, chitterlings, fatback, hot dogs, bratwurst, and packaged luncheon meats. Salted nuts and seeds. Canned beans with salt. °Dairy °Whole or 2% milk, cream, half-and-half, and cream cheese. Whole-fat or sweetened yogurt. Full-fat   cheeses or blue cheese. Nondairy creamers and whipped toppings. Processed cheese, cheese spreads, or cheese  curds. °Condiments °Onion and garlic salt, seasoned salt, table salt, and sea salt. Canned and packaged gravies. Worcestershire sauce. Tartar sauce. Barbecue sauce. Teriyaki sauce. Soy sauce, including reduced sodium. Steak sauce. Fish sauce. Oyster sauce. Cocktail sauce. Horseradish. Ketchup and mustard. Meat flavorings and tenderizers. Bouillon cubes. Hot sauce. Tabasco sauce. Marinades. Taco seasonings. Relishes. °Fats and Oils °Butter, stick margarine, lard, shortening, ghee, and bacon fat. Coconut, palm kernel, or palm oils. Regular salad dressings. °Other °Pickles and olives. Salted popcorn and pretzels. °The items listed above may not be a complete list of foods and beverages to avoid. Contact your dietitian for more information. °WHERE CAN I FIND MORE INFORMATION? °National [employer], [employer], [employer]: www.nhlbi.nih.gov/health/health-topics/topics/dash/ °  °This information is not intended to replace advice given to you by your health care provider. Make sure you discuss any questions you have with your health care provider. °  °Document Released: 05/22/2011 Document Revised: 06/23/2014 Document Reviewed: 04/06/2013 °Elsevier Interactive Patient Education ©2016 Else[employer] Inc[employer] ° °Hypertension °Hypertension, commonly called high blood pressure, is when the force of blood pumping through your arteries is too strong. Your arteries are the blood vessels that carry blood from your heart throughout your body. A blood pressure reading consists of a higher number over a lower number, such as 110/72. The higher number (systolic) is the pressure inside your arteries when your heart pumps. The lower number (diastolic) is the pressure inside your arteries when your heart relaxes. Ideally you want your blood pressure below 120/80. °Hypertension forces your heart to work harder to pump blood. Your arteries may become narrow or stiff. Having untreated or uncontrolled hypertension can cause heart attack, stroke, kidney  disease, and other problems. °RISK FACTORS °Some risk factors for high blood pressure are controllable. Others are not.  °Risk factors you cannot control include:  °· Race. You may be at higher risk if you are African American. °· Age. Risk increases with age. °· Gender. Men are at higher risk than women before age 45 years. After age 65, women are at higher risk than men. °Risk factors you can control include: °· Not getting enough exercise or physical activity. °· Being overweight. °· Getting too much fat, sugar, calories, or salt in your diet. °· Drinking too much alcohol. °SIGNS AND SYMPTOMS °Hypertension does not usually cause signs or symptoms. Extremely high blood pressure (hypertensive crisis) may cause headache, anxiety, shortness of breath, and nosebleed. °DIAGNOSIS °To check if you have hypertension, your health care provider will measure your blood pressure while you are seated, with your arm held at the level of your heart. It should be measured at least twice using the same arm. Certain conditions can cause a difference in blood pressure between your right and left arms. A blood pressure reading that is higher than normal on one occasion does not mean that you need treatment. If it is not clear whether you have high blood pressure, you may be asked to return on a different day to have your blood pressure checked again. Or, you may be asked to monitor your blood pressure at home for 1 or more weeks. °TREATMENT °Treating high blood pressure includes making lifestyle changes and possibly taking medicine. Living a healthy lifestyle can help lower high blood pressure. You may need to change some of your habits. °Lifestyle changes may include: °· Following the DASH diet. This diet is high in fruits, vegetables, and whole   grains. It is low in salt, red meat, and added sugars. °· Keep your sodium intake below 2,300 mg per day. °· Getting at least 30-45 minutes of aerobic exercise at least 4 times per  week. °· Losing weight if necessary. °· Not smoking. °· Limiting alcoholic beverages. °· Learning ways to reduce stress. °Your health care provider may prescribe medicine if lifestyle changes are not enough to get your blood pressure under control, and if one of the following is true: °· You are 18-59 years of age and your systolic blood pressure is above 140. °· You are 60 years of age or older, and your systolic blood pressure is above 150. °· Your diastolic blood pressure is above 90. °· You have diabetes, and your systolic blood pressure is over 140 or your diastolic blood pressure is over 90. °· You have kidney disease and your blood pressure is above 140/90. °· You have heart disease and your blood pressure is above 140/90. °Your personal target blood pressure may vary depending on your medical conditions, your age, and other factors. °HOME CARE INSTRUCTIONS °· Have your blood pressure rechecked as directed by your health care provider.   °· Take medicines only as directed by your health care provider. Follow the directions carefully. Blood pressure medicines must be taken as prescribed. The medicine does not work as well when you skip doses. Skipping doses also puts you at risk for problems. °· Do not smoke.   °· Monitor your blood pressure at home as directed by your health care provider.  °SEEK MEDICAL CARE IF:  °· You think you are having a reaction to medicines taken. °· You have recurrent headaches or feel dizzy. °· You have swelling in your ankles. °· You have trouble with your vision. °SEEK IMMEDIATE MEDICAL CARE IF: °· You develop a severe headache or confusion. °· You have unusual weakness, numbness, or feel faint. °· You have severe chest or abdominal pain. °· You vomit repeatedly. °· You have trouble breathing. °MAKE SURE YOU:  °· Understand these instructions. °· Will watch your condition. °· Will get help right away if you are not doing well or get worse. °  °This information is not intended to  replace advice given to you by your health care provider. Make sure you discuss any questions you have with your health care provider. °  °Document Released: 06/02/2005 Document Revised: 10/17/2014 Document Reviewed: 03/25/2013 °Elsevier Interactive Patient Education ©2016 Elsevier Inc. ° °

## 2015-05-08 NOTE — Addendum Note (Signed)
Addended by: Prescott GumLAND, Hagar Sadiq M on: 05/08/2015 10:58 AM   Modules accepted: Kipp BroodSmartSet

## 2015-05-08 NOTE — Progress Notes (Signed)
   Subjective:    Patient ID: Toni Kim, female    DOB: [birth year], 39 y.o.   MRN: [id number]  Pt presents to the office today to recheck HTN. Pt's BP is not at goal. Hypertension This is a new problem. The current episode started more than 1 month ago. The problem has been waxing and waning since onset. The problem is uncontrolled. Pertinent negatives include no blurred vision, headaches, palpitations, peripheral edema or shortness of breath. Risk factors for coronary artery disease include stress, obesity and family history. Past treatments include nothing. The current treatment provides no improvement. There is no history of kidney disease, CAD/MI, CVA, heart failure or a thyroid problem. There is no history of sleep apnea.      Review of Systems  Constitutional: Negative.   HENT: Negative.   Eyes: Negative.  Negative for blurred vision.  Respiratory: Negative.  Negative for shortness of breath.   Cardiovascular: Negative.  Negative for palpitations.  Gastrointestinal: Negative.   Endocrine: Negative.   Genitourinary: Negative.   Musculoskeletal: Negative.   Neurological: Negative.  Negative for headaches.  Hematological: Negative.   Psychiatric/Behavioral: Negative.   All other systems reviewed and are negative.      Objective:   Physical Exam  Constitutional: She is oriented to person, place, and time. She appears well-developed and well-nourished. No distress.  HENT:  Head: Normocephalic and atraumatic.  Eyes: Pupils are equal, round, and reactive to light.  Neck: Normal range of motion. Neck supple. No thyromegaly present.  Cardiovascular: Normal rate, regular rhythm, normal heart sounds and intact distal pulses.   No murmur heard. Pulmonary/Chest: Effort normal and breath sounds normal. No respiratory distress. She has no wheezes.  Abdominal: Soft. Bowel sounds are normal. She exhibits no distension. There is no tenderness.  Musculoskeletal: Normal range of motion.  She exhibits no edema or tenderness.  Neurological: She is alert and oriented to person, place, and time. She has normal reflexes. No cranial nerve deficit.  Skin: Skin is warm and dry.  Psychiatric: She has a normal mood and affect. Her behavior is normal. Judgment and thought content normal.  Vitals reviewed.   BP 137/93 mmHg  Pulse 82  Temp(Src) 96.7 F (35.9 C) (Oral)  Ht $R'5\' 7"'kx$  (1.702 m)  Wt 244 lb (110.678 kg)  BMI 38.21 kg/m2       Assessment & Plan:  1. Essential hypertension -PT started on HTCZ 12.5 mg today -Dash diet information given -Exercise encouraged - Stress Management  -Continue current meds -RTO in 2 weeks  - hydrochlorothiazide (HYDRODIURIL) 12.5 MG tablet; Take 1 tablet (12.5 mg total) by mouth daily.  Dispense: 90 tablet; Refill: 3 - BMP8+EGFR   Continue all meds Labs pending Health Maintenance reviewed Diet and exercise encouraged RTO 2 weeks   Evelina Dun, FNP

## 2015-05-09 LAB — BMP8+EGFR
BUN/Creatinine Ratio: 11 (ref 8–20)
BUN: 9 mg/dL (ref 6–20)
CALCIUM: 8.9 mg/dL (ref 8.7–10.2)
CHLORIDE: 103 mmol/L (ref 97–106)
CO2: 22 mmol/L (ref 18–29)
Creatinine, Ser: 0.8 mg/dL (ref 0.57–1.00)
GFR calc non Af Amer: 94 mL/min/{1.73_m2} (ref >59)
GFR, EST AFRICAN AMERICAN: 108 mL/min/{1.73_m2} (ref >59)
GLUCOSE: 92 mg/dL (ref 65–99)
Potassium: 4.4 mmol/L (ref 3.5–5.2)
Sodium: 141 mmol/L (ref 136–144)

## 2015-05-11 NOTE — Telephone Encounter (Signed)
Several attempts have been made to contact patient. This encounter will be closed.  

## 2015-05-17 ENCOUNTER — Telehealth: Payer: Self-pay | Admitting: Family

## 2015-05-17 MED ORDER — FLUCONAZOLE 150 MG PO TABS: 150.0000 mg | ORAL_TABLET | ORAL | Status: DC

## 2015-05-17 MED ORDER — BUTOCONAZOLE NITRATE (1 DOSE) 2 % VA CREA: TOPICAL_CREAM | VAGINAL | Status: DC

## 2015-05-17 NOTE — Telephone Encounter (Signed)
RX refilled. If this does not clear up patient will need to be seen

## 2015-05-17 NOTE — Telephone Encounter (Signed)
Refill yeast medication?

## 2015-05-17 NOTE — Telephone Encounter (Signed)
Patient aware of refill and may need to follow up if unimproved.

## 2015-05-22 ENCOUNTER — Ambulatory Visit (INDEPENDENT_AMBULATORY_CARE_PROVIDER_SITE_OTHER): Payer: Medicaid Other | Admitting: Family

## 2015-05-22 ENCOUNTER — Encounter: Payer: Self-pay | Admitting: Family

## 2015-05-22 VITALS — BP 126/76 | HR 80 | Temp 97.5°F | Ht 67.0 in | Wt 243.4 lb

## 2015-05-22 DIAGNOSIS — I10 Essential (primary) hypertension: Secondary | ICD-10-CM

## 2015-05-22 NOTE — Progress Notes (Signed)
   Subjective:    Patient ID: Toni Kim, female    DOB: 1976/06/13, 39 y.o.   MRN: [id number]  Pt presents to the office today to recheck HTN. PT's BP is at goal today!! Hypertension This is a chronic problem. The current episode started more than 1 year ago. The problem has been resolved since onset. The problem is controlled. Pertinent negatives include no anxiety, headaches, palpitations, peripheral edema or shortness of breath. Risk factors for coronary artery disease include dyslipidemia, obesity, post-menopausal state, stress, smoking/tobacco exposure and family history. Past treatments include diuretics. The current treatment provides significant improvement. There is no history of kidney disease, CAD/MI, CVA or heart failure. There is no history of sleep apnea.      Review of Systems  Constitutional: Negative.   HENT: Negative.   Eyes: Negative.   Respiratory: Negative.  Negative for shortness of breath.   Cardiovascular: Negative.  Negative for palpitations.  Gastrointestinal: Negative.   Endocrine: Negative.   Genitourinary: Negative.   Musculoskeletal: Negative.   Neurological: Negative.  Negative for headaches.  Hematological: Negative.   Psychiatric/Behavioral: Negative.   All other systems reviewed and are negative.      Objective:   Physical Exam  Constitutional: She is oriented to person, place, and time. She appears well-developed and well-nourished. No distress.  HENT:  Head: Normocephalic and atraumatic.  Eyes: Pupils are equal, round, and reactive to light.  Neck: Normal range of motion. Neck supple. No thyromegaly present.  Cardiovascular: Normal rate, regular rhythm, normal heart sounds and intact distal pulses.   No murmur heard. Pulmonary/Chest: Effort normal and breath sounds normal. No respiratory distress. She has no wheezes.  Abdominal: Soft. Bowel sounds are normal. She exhibits no distension. There is no tenderness.  Musculoskeletal: Normal range  of motion. She exhibits no edema or tenderness.  Neurological: She is alert and oriented to person, place, and time. She has normal reflexes. No cranial nerve deficit.  Skin: Skin is warm and dry.  Psychiatric: She has a normal mood and affect. Her behavior is normal. Judgment and thought content normal.  Vitals reviewed.     BP 126/76 mmHg  Pulse 80  Temp(Src) 97.5 F (36.4 C) (Oral)  Ht 5\' 7"  (1.702 m)  Wt 243 lb 6.4 oz (110.406 kg)  BMI 38.11 kg/m2     Assessment & Plan:  1. Essential hypertension --Daily blood pressure log given with instructions on how to fill out and told to bring to next visit -Dash diet information given -Exercise encouraged - Stress Management  -Continue current meds -RTO in 6 months  Jannifer Rodneyhristy Hawks, FNP

## 2015-05-22 NOTE — Patient Instructions (Signed)
Hypertension Hypertension, commonly called high blood pressure, is when the force of blood pumping through your arteries is too strong. Your arteries are the blood vessels that carry blood from your heart throughout your body. A blood pressure reading consists of a higher number over a lower number, such as 110/72. The higher number (systolic) is the pressure inside your arteries when your heart pumps. The lower number (diastolic) is the pressure inside your arteries when your heart relaxes. Ideally you want your blood pressure below 120/80. Hypertension forces your heart to work harder to pump blood. Your arteries may become narrow or stiff. Having untreated or uncontrolled hypertension can cause heart attack, stroke, kidney disease, and other problems. RISK FACTORS Some risk factors for high blood pressure are controllable. Others are not.  Risk factors you cannot control include:   Race. You may be at higher risk if you are African American.  Age. Risk increases with age.  Gender. Men are at higher risk than women before age 45 years. After age 65, women are at higher risk than men. Risk factors you can control include:  Not getting enough exercise or physical activity.  Being overweight.  Getting too much fat, sugar, calories, or salt in your diet.  Drinking too much alcohol. SIGNS AND SYMPTOMS Hypertension does not usually cause signs or symptoms. Extremely high blood pressure (hypertensive crisis) may cause headache, anxiety, shortness of breath, and nosebleed. DIAGNOSIS To check if you have hypertension, your health care provider will measure your blood pressure while you are seated, with your arm held at the level of your heart. It should be measured at least twice using the same arm. Certain conditions can cause a difference in blood pressure between your right and left arms. A blood pressure reading that is higher than normal on one occasion does not mean that you need treatment. If  it is not clear whether you have high blood pressure, you may be asked to return on a different day to have your blood pressure checked again. Or, you may be asked to monitor your blood pressure at home for 1 or more weeks. TREATMENT Treating high blood pressure includes making lifestyle changes and possibly taking medicine. Living a healthy lifestyle can help lower high blood pressure. You may need to change some of your habits. Lifestyle changes may include:  Following the DASH diet. This diet is high in fruits, vegetables, and whole grains. It is low in salt, red meat, and added sugars.  Keep your sodium intake below 2,300 mg per day.  Getting at least 30-45 minutes of aerobic exercise at least 4 times per week.  Losing weight if necessary.  Not smoking.  Limiting alcoholic beverages.  Learning ways to reduce stress. Your health care provider may prescribe medicine if lifestyle changes are not enough to get your blood pressure under control, and if one of the following is true:  You are 18-59 years of age and your systolic blood pressure is above 140.  You are 60 years of age or older, and your systolic blood pressure is above 150.  Your diastolic blood pressure is above 90.  You have diabetes, and your systolic blood pressure is over 140 or your diastolic blood pressure is over 90.  You have kidney disease and your blood pressure is above 140/90.  You have heart disease and your blood pressure is above 140/90. Your personal target blood pressure may vary depending on your medical conditions, your age, and other factors. HOME CARE INSTRUCTIONS    Have your blood pressure rechecked as directed by your health care provider.   Take medicines only as directed by your health care provider. Follow the directions carefully. Blood pressure medicines must be taken as prescribed. The medicine does not work as well when you skip doses. Skipping doses also puts you at risk for  problems.  Do not smoke.   Monitor your blood pressure at home as directed by your health care provider. SEEK MEDICAL CARE IF:   You think you are having a reaction to medicines taken.  You have recurrent headaches or feel dizzy.  You have swelling in your ankles.  You have trouble with your vision. SEEK IMMEDIATE MEDICAL CARE IF:  You develop a severe headache or confusion.  You have unusual weakness, numbness, or feel faint.  You have severe chest or abdominal pain.  You vomit repeatedly.  You have trouble breathing. MAKE SURE YOU:   Understand these instructions.  Will watch your condition.  Will get help right away if you are not doing well or get worse.   This information is not intended to replace advice given to you by your health care provider. Make sure you discuss any questions you have with your health care provider.   Document Released: 06/02/2005 Document Revised: 10/17/2014 Document Reviewed: 03/25/2013 Elsevier Interactive Patient Education 2016 [employer]vier Inc[employer]  

## 2015-05-29 ENCOUNTER — Encounter: Payer: Self-pay | Admitting: Family

## 2015-05-29 ENCOUNTER — Ambulatory Visit (INDEPENDENT_AMBULATORY_CARE_PROVIDER_SITE_OTHER): Payer: Medicaid Other | Admitting: Family

## 2015-05-29 VITALS — BP 120/84 | HR 94 | Temp 97.3°F | Ht 67.0 in | Wt 237.0 lb

## 2015-05-29 DIAGNOSIS — M542 Cervicalgia: Secondary | ICD-10-CM | POA: Diagnosis not present

## 2015-05-29 DIAGNOSIS — M5412 Radiculopathy, cervical region: Secondary | ICD-10-CM | POA: Diagnosis not present

## 2015-05-29 DIAGNOSIS — G8929 Other chronic pain: Secondary | ICD-10-CM

## 2015-05-29 MED ORDER — CYCLOBENZAPRINE HCL 5 MG PO TABS
5.0000 mg | ORAL_TABLET | Freq: Three times a day (TID) | ORAL | Status: DC | PRN
Start: 2015-05-29 — End: 2015-10-16

## 2015-05-29 MED ORDER — NAPROXEN 500 MG PO TABS: 500.0000 mg | ORAL_TABLET | Freq: Two times a day (BID) | ORAL | Status: DC

## 2015-05-29 NOTE — Patient Instructions (Signed)

## 2015-05-29 NOTE — Progress Notes (Signed)
   Subjective:    Patient ID: Toni Kim, female    DOB: 1975-07-06, 39 y.o.   MRN: [id number]  Headache  This is a new problem. Associated symptoms include neck pain and numbness (hands). Pertinent negatives include no fever, photophobia, visual change or weakness.  Neck Pain  This is a new problem. The current episode started more than 1 month ago. The problem occurs intermittently. The problem has been waxing and waning. The pain is associated with nothing. The pain is present in the left side and right side. The quality of the pain is described as shooting. The pain is at a severity of 9/10. The pain is moderate. The symptoms are aggravated by bending. Stiffness is present in the morning. Associated symptoms include headaches and numbness (hands). Pertinent negatives include no fever, leg pain, photophobia, trouble swallowing, visual change or weakness. She has tried NSAIDs for the symptoms. The treatment provided mild relief.      Review of Systems  Constitutional: Negative.  Negative for fever.  HENT: Negative for trouble swallowing.   Eyes: Negative.  Negative for photophobia.  Respiratory: Negative.  Negative for shortness of breath.   Cardiovascular: Negative.  Negative for palpitations.  Gastrointestinal: Negative.   Endocrine: Negative.   Genitourinary: Negative.   Musculoskeletal: Positive for neck pain.  Neurological: Positive for numbness (hands) and headaches. Negative for weakness.  Hematological: Negative.   Psychiatric/Behavioral: Negative.   All other systems reviewed and are negative.      Objective:   Physical Exam  Constitutional: She is oriented to person, place, and time. She appears well-developed and well-nourished. No distress.  HENT:  Head: Normocephalic and atraumatic.  Right Ear: External ear normal.  Left Ear: External ear normal.  Nose: Nose normal.  Mouth/Throat: Oropharynx is clear and moist.  Eyes: Pupils are equal, round, and reactive to  light.  Neck: Normal range of motion. Neck supple. No thyromegaly present.  Cardiovascular: Normal rate, regular rhythm, normal heart sounds and intact distal pulses.   No murmur heard. Pulmonary/Chest: Effort normal and breath sounds normal. No respiratory distress. She has no wheezes.  Abdominal: Soft. Bowel sounds are normal. She exhibits no distension. There is no tenderness.  Musculoskeletal: Normal range of motion. She exhibits no edema or tenderness.  Neurological: She is alert and oriented to person, place, and time. She has normal reflexes. No cranial nerve deficit.  Skin: Skin is warm and dry.  Psychiatric: She has a normal mood and affect. Her behavior is normal. Judgment and thought content normal.  Vitals reviewed.   BP 120/84 mmHg  Pulse 94  Temp(Src) 97.3 F (36.3 C) (Oral)  Ht 5\' 7"  (1.702 m)  Wt 237 lb (107.502 kg)  BMI 37.11 kg/m2       Assessment & Plan:  1. Chronic neck pain - Ambulatory referral to Orthopedic Surgery  2. Cervical radiculitis -ROM exercises  -Sedation precaution discussed -No other NSAID's while taking naprosyn -RTO prn  - cyclobenzaprine (FLEXERIL) 5 MG tablet; Take 1 tablet (5 mg total) by mouth 3 (three) times daily as needed for muscle spasms.  Dispense: 30 tablet; Refill: 0 - naproxen (NAPROSYN) 500 MG tablet; Take 1 tablet (500 mg total) by mouth 2 (two) times daily with a meal.  Dispense: 30 tablet; Refill: 0  Jannifer Rodneyhristy Deari Sessler, FNP

## 2015-07-05 ENCOUNTER — Emergency Department (HOSPITAL_COMMUNITY)
Admission: EM | Admit: 2015-07-05 | Discharge: 2015-07-05 | Disposition: A | Discharge status: Home / Self Care | Payer: Medicaid Other | Attending: Emergency Medicine | Admitting: Emergency Medicine

## 2015-07-05 ENCOUNTER — Encounter (HOSPITAL_COMMUNITY): Payer: Self-pay

## 2015-07-05 DIAGNOSIS — K0889 Other specified disorders of teeth and supporting structures: Secondary | ICD-10-CM

## 2015-07-05 DIAGNOSIS — Z8739 Personal history of other diseases of the musculoskeletal system and connective tissue: Secondary | ICD-10-CM | POA: Diagnosis not present

## 2015-07-05 DIAGNOSIS — Z79899 Other long term (current) drug therapy: Secondary | ICD-10-CM | POA: Diagnosis not present

## 2015-07-05 DIAGNOSIS — E669 Obesity, unspecified: Secondary | ICD-10-CM | POA: Diagnosis not present

## 2015-07-05 DIAGNOSIS — K08409 Partial loss of teeth, unspecified cause, unspecified class: Secondary | ICD-10-CM | POA: Diagnosis not present

## 2015-07-05 DIAGNOSIS — F17210 Nicotine dependence, cigarettes, uncomplicated: Secondary | ICD-10-CM | POA: Diagnosis not present

## 2015-07-05 DIAGNOSIS — K002 Abnormalities of size and form of teeth: Secondary | ICD-10-CM | POA: Insufficient documentation

## 2015-07-05 DIAGNOSIS — M1990 Unspecified osteoarthritis, unspecified site: Secondary | ICD-10-CM | POA: Diagnosis not present

## 2015-07-05 MED ORDER — OXYCODONE-ACETAMINOPHEN 5-325 MG PO TABS
1.0000 | ORAL_TABLET | Freq: Once | ORAL | Status: AC
  Administered 2015-07-05: 1 via ORAL
  Filled 2015-07-05: qty 1

## 2015-07-05 MED ORDER — NAPROXEN 500 MG PO TABS: 500.0000 mg | ORAL_TABLET | Freq: Two times a day (BID) | ORAL | Status: DC

## 2015-07-05 MED ORDER — KETOROLAC TROMETHAMINE 60 MG/2ML IM SOLN
60.0000 mg | Freq: Once | INTRAMUSCULAR | Status: AC
  Administered 2015-07-05: 60 mg via INTRAMUSCULAR
  Filled 2015-07-05: qty 2

## 2015-07-05 MED ORDER — PENICILLIN V POTASSIUM 500 MG PO TABS: 500.0000 mg | ORAL_TABLET | Freq: Four times a day (QID) | ORAL | Status: AC

## 2015-07-05 NOTE — ED Provider Notes (Signed)
CSN: [id number]     Arrival date & time 07/05/15  0231 History   First MD Initiated Contact with Patient 07/05/15 0315     Chief Complaint  Patient presents with  . Dental Pain     (Consider location/radiation/quality/duration/timing/severity/associated sxs/prior Treatment) HPI  This a 40 year old female who presents with dental pain. Patient reports dental pain on and off the right upper gum for several months. Acute pain worsened over the last 12 hours. She's not taking anything at home for pain. Currently rates her pain a 10 out of 10. Denies any fevers or swelling. Denies any difficulty swallowing. She does have a dentist but has not followed with that dentist.  Past Medical History  Diagnosis Date  . H/O hiatal hernia   . Arthritis    Past Surgical History  Procedure Laterality Date  . Cesarean section    . Lumbar laminectomy/decompression microdiscectomy Right 12/21/2012    Procedure: LUMBAR LAMINECTOMY/DECOMPRESSION MICRODISCECTOMY 2 level;  Surgeon: Karn Cassis, MD;  Location: [facility];  Service: Neurosurgery;  Laterality: Right;  Right Lumbar three-four,Lumbar four-five Microdiskectomy  . Spine surgery     Family History  Problem Relation Age of Onset  . Heart disease Mother     MI   Social History  Substance Use Topics  . Smoking status: Current Every Day Smoker -- 1.00 packs/day for 10 years  . Smokeless tobacco: None  . Alcohol Use: No   OB History    No data available     Review of Systems  Constitutional: Negative for fever.  HENT: Positive for dental problem. Negative for trouble swallowing.   All other systems reviewed and are negative.     Allergies  Review of patient's allergies indicates no known allergies.  Home Medications   Prior to Admission medications   Medication Sig Start Date End Date Taking? Authorizing Provider  cyclobenzaprine (FLEXERIL) 5 MG tablet Take 1 tablet (5 mg total) by mouth 3 (three) times daily as needed for  muscle spasms. 05/29/15  Yes Junie Spencer, FNP  hydrochlorothiazide (HYDRODIURIL) 12.5 MG tablet Take 1 tablet (12.5 mg total) by mouth daily. 05/08/15  Yes Junie Spencer, FNP  levonorgestrel (MIRENA) 20 MCG/24HR IUD 1 each by Intrauterine route once.   Yes Historical Provider, MD  omeprazole (PRILOSEC) 20 MG capsule Take 1 capsule (20 mg total) by mouth daily. 04/24/15  Yes Junie Spencer, FNP  gabapentin (NEURONTIN) 300 MG capsule Take 300 mg by mouth 3 (three) times daily.    Historical Provider, MD  naproxen (NAPROSYN) 500 MG tablet Take 1 tablet (500 mg total) by mouth 2 (two) times daily. 07/05/15   Shon Baton, MD  penicillin v potassium (VEETID) 500 MG tablet Take 1 tablet (500 mg total) by mouth 4 (four) times daily. 07/05/15 07/12/15  Shon Baton, MD   BP 137/97 mmHg  Pulse 97  Temp(Src) 97.9 F (36.6 C) (Oral)  Resp 20  Ht  (1.651 m)  Wt 240 lb (108.863 kg)  BMI 39.94 kg/m2  SpO2 98%  LMP 06/15/2015 Physical Exam  Constitutional: She is oriented to person, place, and time. She appears well-developed and well-nourished.  Obese  HENT:  Head: Normocephalic and atraumatic.  Generally poor dentition, multiple missing teeth, tenderness to palpation over the right upper gum in the position of the right upper premolar, no obvious abscess, no trismus, uvula midline, no fullness noted underneath the tongue  Neck: Neck supple.  Cardiovascular: Normal rate and regular rhythm.  Pulmonary/Chest: Effort normal. No respiratory distress.  Neurological: She is alert and oriented to person, place, and time.  Skin: Skin is warm and dry.  Psychiatric: She has a normal mood and affect.  Nursing note and vitals reviewed.   ED Course  Procedures (including critical care time) Labs Review Labs Reviewed - No data to display  Imaging Review No results found. I have personally reviewed and evaluated these images and lab results as part of my medical decision-making.   EKG  Interpretation None      MDM   Final diagnoses:  Pain, dental   Patient presents with dental pain. Nontoxic on exam. No signs or symptoms of [place].  Generally poor dentition. Discuss with patient empiric antibiotics and follow-up with dentistry. Naproxen twice a day for pain.  After history, exam, and medical workup I feel the patient has been appropriately medically screened and is safe for discharge home. Pertinent diagnoses were discussed with the patient. Patient was given return precautions.     Shon Baton, MD 07/05/15 [phone]

## 2015-07-05 NOTE — ED Notes (Signed)
Right upper dental pain for several months, states she was here visiting another patient when she decided to get checked.  Was also concerned with her blood pressure possibly running high

## 2015-07-05 NOTE — Discharge Instructions (Signed)

## 2015-09-20 ENCOUNTER — Other Ambulatory Visit: Payer: Self-pay | Admitting: Family

## 2015-09-24 ENCOUNTER — Ambulatory Visit: Payer: Medicaid Other | Admitting: Family Medicine

## 2015-10-16 ENCOUNTER — Ambulatory Visit (INDEPENDENT_AMBULATORY_CARE_PROVIDER_SITE_OTHER): Payer: Medicaid Other | Admitting: Family

## 2015-10-16 ENCOUNTER — Encounter: Payer: Self-pay | Admitting: Family

## 2015-10-16 VITALS — BP 151/88 | HR 94 | Temp 97.0°F | Ht 65.0 in | Wt 250.4 lb

## 2015-10-16 DIAGNOSIS — R079 Chest pain, unspecified: Secondary | ICD-10-CM

## 2015-10-16 DIAGNOSIS — W57XXXA Bitten or stung by nonvenomous insect and other nonvenomous arthropods, initial encounter: Secondary | ICD-10-CM | POA: Diagnosis not present

## 2015-10-16 DIAGNOSIS — S3092XA Unspecified superficial injury of abdominal wall, initial encounter: Secondary | ICD-10-CM | POA: Diagnosis not present

## 2015-10-16 DIAGNOSIS — M79602 Pain in left arm: Secondary | ICD-10-CM

## 2015-10-16 MED ORDER — TRIAMCINOLONE ACETONIDE 0.025 % EX OINT: 1.0000 "application " | TOPICAL_OINTMENT | Freq: Two times a day (BID) | CUTANEOUS | Status: DC

## 2015-10-16 MED ORDER — CYCLOBENZAPRINE HCL 10 MG PO TABS: 10.0000 mg | ORAL_TABLET | Freq: Three times a day (TID) | ORAL | Status: DC | PRN

## 2015-10-16 NOTE — Patient Instructions (Signed)
Insect Bite Mosquitoes, flies, fleas, bedbugs, and many other insects can bite. Insect bites are different from insect stings. A sting is when poison (venom) is injected into the skin. Insect bites can cause pain or itching for a few days, but they are usually not serious. Some insects can spread diseases to people through a bite. SYMPTOMS  Symptoms of an insect bite include:  Itching or pain in the bite area.  Redness and swelling in the bite area.  An open wound (skin ulcer). In many cases, symptoms last for 2-4 days.  DIAGNOSIS  This condition is usually diagnosed based on symptoms and a physical exam. TREATMENT  Treatment is usually not needed for an insect bite. Symptoms often go away on their own. Your health care provider may recommend creams or lotions to help reduce itching. Antibiotic medicines may be prescribed if the bite becomes infected. A tetanus shot may be given in some cases. If you develop an allergic reaction to an insect bite, your health care provider will prescribe medicines to treat the reaction (antihistamines). This is rare. HOME CARE INSTRUCTIONS  Do not scratch the bite area.  Keep the bite area clean and dry. Wash the bite area daily with soap and water as told by your health care provider.  If directed, applyice to the bite area.  Put ice in a plastic bag.  Place a towel between your skin and the bag.  Leave the ice on for 20 minutes, 2-3 times per day.  To help reduce itching and swelling, try applying a baking soda paste, cortisone cream, or calamine lotion to the bite area as told by your health care provider.  Apply or take over-the-counter and prescription medicines only as told by your health care provider.  If you were prescribed an antibiotic medicine, use it as told by your health care provider. Do not stop using the antibiotic even if your condition improves.  Keep all follow-up visits as told by your health care provider. This is  important. PREVENTION   Use insect repellent. The best insect repellents contain:  DEET, picaridin, oil of lemon eucalyptus (OLE), or IR3535.  Higher amounts of an active ingredient.  When you are outdoors, wear clothing that covers your arms and legs.  Avoid opening windows that do not have window screens. SEEK MEDICAL CARE IF:  You have increased redness, swelling, or pain in the bite area.  You have a fever. SEEK IMMEDIATE MEDICAL CARE IF:   You have joint pain.   You have fluid, blood, or pus coming from the bite area.  You have a headache or neck pain.  You have unusual weakness.  You have a rash.  You have chest pain or shortness of breath.  You have abdominal pain, nausea, or vomiting.  You feel unusually tired or sleepy.   This information is not intended to replace advice given to you by your health care provider. Make sure you discuss any questions you have with your health care provider.   Document Released: 07/10/2004 Document Revised: 02/21/2015 Document Reviewed: 10/18/2014 Elsevier Interactive Patient Education 2016 [employer] Inc.  

## 2015-10-16 NOTE — Progress Notes (Signed)
   Subjective:    Patient ID: Toni Kim, female    DOB: [birth year], 40 y.o.   MRN: [id number]  Rash This is a new problem. The current episode started in the past 7 days. The problem has been gradually worsening since onset. The affected locations include the abdomen. The rash is characterized by itchiness, redness, swelling and pain. It is unknown if there was an exposure to a precipitant. Pertinent negatives include no congestion, cough, diarrhea, fatigue, joint pain, shortness of breath or sore throat. Past treatments include anti-itch cream. The treatment provided mild relief.  Arm Pain  The incident occurred more than 1 week ago. There was no injury mechanism. The pain is present in the left shoulder. The quality of the pain is described as aching. The pain is at a severity of 9/10. The pain is mild. The pain has been intermittent since the incident. Associated symptoms include numbness and tingling. Nothing aggravates the symptoms. She has tried ice for the symptoms. The treatment provided mild relief.      Review of Systems  Constitutional: Negative for fatigue.  HENT: Negative for congestion and sore throat.   Respiratory: Negative for cough and shortness of breath.   Gastrointestinal: Negative for diarrhea.  Musculoskeletal: Negative for joint pain.  Skin: Positive for rash.  Neurological: Positive for tingling and numbness.  All other systems reviewed and are negative.      Objective:   Physical Exam  Constitutional: She is oriented to person, place, and time. She appears well-developed and well-nourished. No distress.  HENT:  Head: Normocephalic and atraumatic.  Eyes: Pupils are equal, round, and reactive to light.  Neck: Normal range of motion. Neck supple. No thyromegaly present.  Cardiovascular: Normal rate, regular rhythm, normal heart sounds and intact distal pulses.   No murmur heard. Pulmonary/Chest: Effort normal and breath sounds normal. No respiratory distress.  She has no wheezes.  Abdominal: Soft. Bowel sounds are normal. She exhibits no distension. There is no tenderness.  Musculoskeletal: Normal range of motion. She exhibits no edema or tenderness.  Neurological: She is alert and oriented to person, place, and time. She has normal reflexes. No cranial nerve deficit.  Skin: Skin is warm and dry.  Small abrasion erythemas 2cmX2cm  Psychiatric: She has a normal mood and affect. Her behavior is normal. Judgment and thought content normal.  Vitals reviewed.   BP 151/88 mmHg  Pulse 94  Temp(Src) 97 F (36.1 C) (Oral)  Ht 5\' 5"  (1.651 m)  Wt 250 lb 6.4 oz (113.581 kg)  BMI 41.67 kg/m2  EKG- Sinus Rhythm      Assessment & Plan:  1. Chest pain, unspecified chest pain type -Stress management discussed - EKG 12-Lead  2. Left arm pain -Flexeril as needed, sedation precautions  -Motrin prn - EKG 12-Lead - cyclobenzaprine (FLEXERIL) 10 MG tablet; Take 1 tablet (10 mg total) by mouth 3 (three) times daily as needed for muscle spasms.  Dispense: 30 tablet; Refill: 0  3. Insect bite -Keep clean and dry -Do not scratch  -RTO prn - triamcinolone (KENALOG) 0.025 % ointment; Apply 1 application topically 2 (two) times daily.  Dispense: 30 g; Refill: 0  Jannifer Rodneyhristy Hawks, FNP

## 2015-12-24 ENCOUNTER — Ambulatory Visit (INDEPENDENT_AMBULATORY_CARE_PROVIDER_SITE_OTHER): Payer: Medicaid Other | Admitting: Family

## 2015-12-24 ENCOUNTER — Encounter: Payer: Self-pay | Admitting: Family

## 2015-12-24 VITALS — BP 128/97 | HR 97 | Temp 97.0°F | Ht 65.0 in | Wt 251.6 lb

## 2015-12-24 DIAGNOSIS — L732 Hidradenitis suppurativa: Secondary | ICD-10-CM

## 2015-12-24 MED ORDER — NYSTATIN 100000 UNIT/GM EX POWD: Freq: Four times a day (QID) | CUTANEOUS | Status: DC

## 2015-12-24 MED ORDER — DOXYCYCLINE HYCLATE 100 MG PO TABS: 100.0000 mg | ORAL_TABLET | Freq: Two times a day (BID) | ORAL | Status: DC

## 2015-12-24 NOTE — Patient Instructions (Signed)

## 2015-12-24 NOTE — Progress Notes (Signed)
   Subjective:    Patient ID: Toni FrameLisa Prout, female    DOB: [birth year], 40 y.o.   MRN: [id number]  HPI Pt presents to the office today with multiple abscesses. PT states she has had "boils pop up" under both axially, right breast, stomach, and  groin,. PT states she noticed it several days ago, but has since "busted". Pt states that once they drain they come back. PT states she is having 10 out 10 pain with her right breast. PT states she is having a yellow thick discharge.    Review of Systems  Skin: Positive for wound.  All other systems reviewed and are negative.      Objective:   Physical Exam  Constitutional: She is oriented to person, place, and time. She appears well-developed and well-nourished. No distress.  HENT:  Head: Normocephalic and atraumatic.  Cardiovascular: Normal rate, regular rhythm, normal heart sounds and intact distal pulses.   No murmur heard. Pulmonary/Chest: Effort normal and breath sounds normal. No respiratory distress. She has no wheezes.  Abdominal: Soft. Bowel sounds are normal. She exhibits no distension. There is no tenderness.  Musculoskeletal: Normal range of motion. She exhibits no edema or tenderness.  Neurological: She is alert and oriented to person, place, and time. She has normal reflexes. No cranial nerve deficit.  Skin: Skin is warm and dry.  Lesion on right breast 1.5cmX 2cm, erythemas. No drainage present, 2 hard abscess in left and right groin   Psychiatric: She has a normal mood and affect. Her behavior is normal. Judgment and thought content normal.  Vitals reviewed.   BP 128/97 mmHg  Pulse 97  Temp(Src) 97 F (36.1 C) (Oral)  Ht 5\' 5"  (1.651 m)  Wt 251 lb 9.6 oz (114.125 kg)  BMI 41.87 kg/m2       Assessment & Plan:  1. Hidradenitis suppurativa -Keep clean and dry -Do not squeeze or pick at -Referral pending -Loose fit clothing -Avoid shaving in infected areas -RTO prn - Ambulatory referral to Dermatology - nystatin  (MYCOSTATIN/NYSTOP) powder; Apply topically 4 (four) times daily.  Dispense: 15 g; Refill: 0 - doxycycline (VIBRA-TABS) 100 MG tablet; Take 1 tablet (100 mg total) by mouth 2 (two) times daily.  Dispense: 14 tablet; Refill: 0  Jannifer Rodneyhristy Hawks, FNP

## 2016-04-27 ENCOUNTER — Other Ambulatory Visit: Payer: Self-pay | Admitting: Family

## 2016-05-02 ENCOUNTER — Ambulatory Visit (INDEPENDENT_AMBULATORY_CARE_PROVIDER_SITE_OTHER): Payer: Medicaid Other | Admitting: Family

## 2016-05-02 ENCOUNTER — Encounter: Payer: Self-pay | Admitting: Family

## 2016-05-02 VITALS — BP 125/82 | HR 94 | Temp 97.1°F | Ht 65.0 in | Wt 252.0 lb

## 2016-05-02 DIAGNOSIS — J0100 Acute maxillary sinusitis, unspecified: Secondary | ICD-10-CM

## 2016-05-02 DIAGNOSIS — G5601 Carpal tunnel syndrome, right upper limb: Secondary | ICD-10-CM

## 2016-05-02 DIAGNOSIS — K219 Gastro-esophageal reflux disease without esophagitis: Secondary | ICD-10-CM

## 2016-05-02 MED ORDER — OMEPRAZOLE 20 MG PO CPDR: 20.0000 mg | DELAYED_RELEASE_CAPSULE | Freq: Every day | ORAL | 1 refills | Status: DC

## 2016-05-02 MED ORDER — AMOXICILLIN-POT CLAVULANATE 875-125 MG PO TABS: 1.0000 | ORAL_TABLET | Freq: Two times a day (BID) | ORAL | 0 refills | Status: DC

## 2016-05-02 MED ORDER — NAPROXEN 500 MG PO TABS: 500.0000 mg | ORAL_TABLET | Freq: Two times a day (BID) | ORAL | 1 refills | Status: DC

## 2016-05-02 NOTE — Patient Instructions (Signed)
Carpal Tunnel Syndrome Carpal tunnel syndrome is a condition that causes pain in your hand and arm. The carpal tunnel is a narrow area located on the palm side of your wrist. Repeated wrist motion or certain diseases may cause swelling within the tunnel. This swelling pinches the main nerve in the wrist (median nerve). What are the causes? This condition may be caused by:  Repeated wrist motions.  Wrist injuries.  Arthritis.  A cyst or tumor in the carpal tunnel.  Fluid buildup during pregnancy.  Sometimes the cause of this condition is not known. What increases the risk? This condition is more likely to develop in:  People who have jobs that cause them to repeatedly move their wrists in the same motion, such as butchers and cashiers.  Women.  People with certain conditions, such as: ? Diabetes. ? Obesity. ? An underactive thyroid (hypothyroidism). ? Kidney failure.  What are the signs or symptoms? Symptoms of this condition include:  A tingling feeling in your fingers, especially in your thumb, index, and middle fingers.  Tingling or numbness in your hand.  An aching feeling in your entire arm, especially when your wrist and elbow are bent for long periods of time.  Wrist pain that goes up your arm to your shoulder.  Pain that goes down into your palm or fingers.  A weak feeling in your hands. You may have trouble grabbing and holding items.  Your symptoms may feel worse during the night. How is this diagnosed? This condition is diagnosed with a medical history and physical exam. You may also have tests, including:  An electromyogram (EMG). This test measures electrical signals sent by your nerves into the muscles.  X-rays.  How is this treated? Treatment for this condition includes:  Lifestyle changes. It is important to stop doing or modify the activity that caused your condition.  Physical or occupational therapy.  Medicines for pain and inflammation.  This may include medicine that is injected into your wrist.  A wrist splint.  Surgery.  Follow these instructions at home: If you have a splint:  Wear it as told by your health care provider. Remove it only as told by your health care provider.  Loosen the splint if your fingers become numb and tingle, or if they turn cold and blue.  Keep the splint clean and dry. General instructions  Take over-the-counter and prescription medicines only as told by your health care provider.  Rest your wrist from any activity that may be causing your pain. If your condition is work related, talk to your employer about changes that can be made, such as getting a wrist pad to use while typing.  If directed, apply ice to the painful area: ? Put ice in a plastic bag. ? Place a towel between your skin and the bag. ? Leave the ice on for 20 minutes, 2-3 times per day.  Keep all follow-up visits as told by your health care provider. This is important.  Do any exercises as told by your health care provider, physical therapist, or occupational therapist. Contact a health care provider if:  You have new symptoms.  Your pain is not controlled with medicines.  Your symptoms get worse. This information is not intended to replace advice given to you by your health care provider. Make sure you discuss any questions you have with your health care provider. Document Released: 05/30/2000 Document Revised: 10/11/2015 Document Reviewed: 10/18/2014 Elsevier Interactive Patient Education  2017 [employer] Inc[employer]  

## 2016-05-02 NOTE — Progress Notes (Signed)
Subjective:    Patient ID: Toni Kim, female    DOB: 08-30-1975, 40 y.o.   MRN: [id number]  Sinusitis  This is a new problem. The current episode started in the past 7 days. The problem has been gradually worsening since onset. Associated symptoms include chills, congestion, coughing, headaches, a hoarse voice, shortness of breath, sinus pressure, sneezing and a sore throat. Pertinent negatives include no ear pain. Past treatments include lying down, oral decongestants and acetaminophen. The treatment provided mild relief.  Cough  Associated symptoms include chills, headaches, heartburn, a sore throat and shortness of breath. Pertinent negatives include no ear pain.  Hand Pain   The incident occurred more than 1 week ago. The injury mechanism was repetitive motion. The pain is present in the right hand. The quality of the pain is described as aching. The pain does not radiate. The pain is at a severity of 10/10. The pain is mild. The pain has been intermittent since the incident. Associated symptoms include numbness and tingling. The symptoms are aggravated by lifting. She has tried rest and NSAIDs for the symptoms. The treatment provided mild relief.  Gastroesophageal Reflux  She complains of belching, coughing, heartburn, a hoarse voice and a sore throat. This is a chronic problem. The current episode started more than 1 year ago. The problem occurs occasionally. The problem has been waxing and waning. The symptoms are aggravated by lying down. She has tried a PPI for the symptoms. The treatment provided significant relief.      Review of Systems  Constitutional: Positive for chills.  HENT: Positive for congestion, hoarse voice, sinus pressure, sneezing and sore throat. Negative for ear pain.   Respiratory: Positive for cough and shortness of breath.   Gastrointestinal: Positive for heartburn.  Neurological: Positive for tingling, numbness and headaches.  All other systems reviewed and  are negative.      Objective:   Physical Exam  Constitutional: She is oriented to person, place, and time. She appears well-developed and well-nourished. No distress.  HENT:  Head: Normocephalic and atraumatic.  Right Ear: External ear normal.  Left Ear: External ear normal.  Nose: Mucosal edema and rhinorrhea present. Right sinus exhibits maxillary sinus tenderness. Left sinus exhibits maxillary sinus tenderness.  Mouth/Throat: Posterior oropharyngeal erythema present.  Eyes: Pupils are equal, round, and reactive to light.  Neck: Normal range of motion. Neck supple. No thyromegaly present.  Cardiovascular: Normal rate, regular rhythm, normal heart sounds and intact distal pulses.   No murmur heard. Pulmonary/Chest: Effort normal and breath sounds normal. No respiratory distress. She has no wheezes.  Abdominal: Soft. Bowel sounds are normal. She exhibits no distension. There is no tenderness.  Musculoskeletal: Normal range of motion. She exhibits tenderness. She exhibits no edema.  Positive tinel and phalen   Neurological: She is alert and oriented to person, place, and time. She has normal reflexes. No cranial nerve deficit.  Skin: Skin is warm and dry.  Psychiatric: She has a normal mood and affect. Her behavior is normal. Judgment and thought content normal.  Vitals reviewed.     BP 125/82   Pulse 94   Temp 97.1 F (36.2 C) (Oral)   Ht 5\' 5"  (1.651 m)   Wt 252 lb (114.3 kg)   BMI 41.93 kg/m      Assessment & Plan:  1. Carpal tunnel syndrome of right wrist -Pt to get brace and wear every night and while working  - naproxen (NAPROSYN) 500 MG tablet; Take 1 tablet (  500 mg total) by mouth 2 (two) times daily with a meal.  Dispense: 60 tablet; Refill: 1  2. Acute maxillary sinusitis, recurrence not specified -- Take meds as prescribed - Use a cool mist humidifier  -Use saline nose sprays frequently -Saline irrigations of the nose can be very helpful if done  frequently.  * 4X daily for 1 week*  * Use of a nettie pot can be helpful with this. Follow directions with this* -Force fluids -For any cough or congestion  Use plain Mucinex- regular strength or max strength is fine   * Children- consult with Pharmacist for dosing -For fever or aces or pains- take tylenol or ibuprofen appropriate for age and weight.  * for fevers greater than 101 orally you may alternate ibuprofen and tylenol every  3 hours. -Throat lozenges if help - amoxicillin-clavulanate (AUGMENTIN) 875-125 MG tablet; Take 1 tablet by mouth 2 (two) times daily.  Dispense: 14 tablet; Refill: 0  3. Gastroesophageal reflux disease, esophagitis presence not specified --Diet discussed- Avoid fried, spicy, citrus foods, caffeine and alcohol -Do not eat 2-3 hours before bedtime -Encouraged small frequent meals - omeprazole (PRILOSEC) 20 MG capsule; Take 1 capsule (20 mg total) by mouth daily.  Dispense: 90 capsule; Refill: 1   Continue all meds Labs pending Health Maintenance reviewed Diet and exercise encouraged RTO as needed and keep chronic follow up appts  Jannifer Rodneyhristy Dennies Coate, FNP

## 2016-05-10 ENCOUNTER — Other Ambulatory Visit: Payer: Self-pay | Admitting: Family

## 2016-05-10 DIAGNOSIS — I10 Essential (primary) hypertension: Secondary | ICD-10-CM

## 2016-05-15 ENCOUNTER — Other Ambulatory Visit: Payer: Self-pay | Admitting: Physician Assistant

## 2016-06-12 ENCOUNTER — Encounter: Payer: Self-pay | Admitting: Pediatrics

## 2016-06-12 ENCOUNTER — Ambulatory Visit (INDEPENDENT_AMBULATORY_CARE_PROVIDER_SITE_OTHER): Payer: Medicaid Other | Admitting: Pediatrics

## 2016-06-12 VITALS — BP 122/80 | HR 77 | Temp 96.8°F | Resp 18 | Ht 65.0 in | Wt 247.8 lb

## 2016-06-12 DIAGNOSIS — R062 Wheezing: Secondary | ICD-10-CM

## 2016-06-12 DIAGNOSIS — B379 Candidiasis, unspecified: Secondary | ICD-10-CM

## 2016-06-12 DIAGNOSIS — J019 Acute sinusitis, unspecified: Secondary | ICD-10-CM

## 2016-06-12 MED ORDER — ALBUTEROL SULFATE HFA 108 (90 BASE) MCG/ACT IN AERS: 2.0000 | INHALATION_SPRAY | Freq: Four times a day (QID) | RESPIRATORY_TRACT | 0 refills | Status: DC | PRN

## 2016-06-12 MED ORDER — DOXYCYCLINE HYCLATE 100 MG PO TABS: 100.0000 mg | ORAL_TABLET | Freq: Two times a day (BID) | ORAL | 0 refills | Status: DC

## 2016-06-12 MED ORDER — FLUCONAZOLE 150 MG PO TABS: 150.0000 mg | ORAL_TABLET | ORAL | 0 refills | Status: DC | PRN

## 2016-06-12 NOTE — Progress Notes (Signed)
  Subjective:   Patient ID: Toni Kim Ercole, female    DOB: [birth year], 40 y.o.   MRN: [id number] CC: Cough; Chest Congestion; Hoarse; and Nasal Congestion  HPI: Toni Kim Fifield is a 40 y.o. female presenting for Cough; Chest Congestion; Hoarse; and Nasal Congestion  Started about six weeks ago Feels dehydrated Has cough and congestion Hoarseness ongoing Says she hasnt gotten better at all since before thanksgiving Feeling achey over neck and around shoulders Taking OTC cough medicines  Smoking daily, about half a pack a day  Relevant past medical, surgical, family and social history reviewed. Allergies and medications reviewed and updated. History  Smoking Status  . Current Every Day Smoker  . Packs/day: 1.00  . Years: 10.00  Smokeless Tobacco  . Never Used   ROS: Per HPI   Objective:    BP 122/80   Pulse (!) 150   Temp (!) 96.8 F (36 C) (Oral)   Resp 18   Ht 5\' 5"  (1.651 m)   Wt 247 lb 12.8 oz (112.4 kg)   SpO2 96%   BMI 41.24 kg/m   Wt Readings from Last 3 Encounters:  06/12/16 247 lb 12.8 oz (112.4 kg)  05/02/16 252 lb (114.3 kg)  12/24/15 251 lb 9.6 oz (114.1 kg)    Gen: NAD, alert, cooperative with exam, NCAT EYES: EOMI, no conjunctival injection, or no icterus ENT:  TMs dull gray b/l, OP with mild erythema LYMPH: no cervical LAD CV: NRRR, normal S1/S2, no murmur, distal pulses 2+ b/l Resp: moving air well, few scattered wheezes, normal WOB Abd: +BS, soft, NTND. no guarding or organomegaly Ext: No edema, warm Neuro: Alert and oriented MSK: normal muscle bulk  Assessment & Plan:  Misty StanleyLisa was seen today for cough, chest congestion, hoarse and nasal congestion.  Diagnoses and all orders for this visit:  Wheezing Use TID while sick If needing regularly when well needs to be seen -     albuterol (PROVENTIL HFA;VENTOLIN HFA) 108 (90 Base) MCG/ACT inhaler; Inhale 2 puffs into the lungs every 6 (six) hours as needed for wheezing or shortness of breath.  Acute  sinusitis, recurrence not specified, unspecified location Discussed symptomatic care Start below -     doxycycline (VIBRA-TABS) 100 MG tablet; Take 1 tablet (100 mg total) by mouth 2 (two) times daily.  Yeast infection -     fluconazole (DIFLUCAN) 150 MG tablet; Take 1 tablet (150 mg total) by mouth every three (3) days as needed.   Follow up plan: As needed Rex Krasarol Jessicamarie Amiri, MD [facility]

## 2016-09-09 ENCOUNTER — Ambulatory Visit (INDEPENDENT_AMBULATORY_CARE_PROVIDER_SITE_OTHER): Payer: Medicaid Other | Admitting: *Deleted

## 2016-09-09 DIAGNOSIS — Z111 Encounter for screening for respiratory tuberculosis: Secondary | ICD-10-CM | POA: Diagnosis not present

## 2016-09-09 DIAGNOSIS — Z23 Encounter for immunization: Secondary | ICD-10-CM

## 2016-09-09 NOTE — Progress Notes (Signed)
PPD placed L forearm Pt tolerated well 

## 2016-09-11 ENCOUNTER — Encounter: Payer: Self-pay | Admitting: *Deleted

## 2016-09-11 LAB — TB SKIN TEST
INDURATION: 0 mm
TB SKIN TEST: NEGATIVE

## 2016-09-15 DIAGNOSIS — Z1231 Encounter for screening mammogram for malignant neoplasm of breast: Secondary | ICD-10-CM | POA: Diagnosis not present

## 2016-11-28 ENCOUNTER — Other Ambulatory Visit: Payer: Self-pay | Admitting: Family

## 2016-11-28 DIAGNOSIS — I10 Essential (primary) hypertension: Secondary | ICD-10-CM

## 2016-12-27 ENCOUNTER — Other Ambulatory Visit: Payer: Self-pay | Admitting: Family

## 2016-12-27 DIAGNOSIS — I10 Essential (primary) hypertension: Secondary | ICD-10-CM

## 2016-12-30 ENCOUNTER — Encounter: Payer: Self-pay | Admitting: Family

## 2016-12-30 ENCOUNTER — Ambulatory Visit (INDEPENDENT_AMBULATORY_CARE_PROVIDER_SITE_OTHER): Payer: Medicaid Other | Admitting: Family

## 2016-12-30 VITALS — BP 116/76 | HR 98 | Temp 98.1°F | Ht 65.0 in | Wt 245.0 lb

## 2016-12-30 DIAGNOSIS — M5441 Lumbago with sciatica, right side: Secondary | ICD-10-CM

## 2016-12-30 DIAGNOSIS — G8929 Other chronic pain: Secondary | ICD-10-CM | POA: Diagnosis not present

## 2016-12-30 DIAGNOSIS — G5601 Carpal tunnel syndrome, right upper limb: Secondary | ICD-10-CM | POA: Diagnosis not present

## 2016-12-30 DIAGNOSIS — M542 Cervicalgia: Secondary | ICD-10-CM

## 2016-12-30 MED ORDER — MELOXICAM 15 MG PO TABS: 15.0000 mg | ORAL_TABLET | Freq: Every day | ORAL | 0 refills | Status: DC

## 2016-12-30 MED ORDER — PREDNISONE 10 MG (21) PO TBPK: ORAL_TABLET | ORAL | 0 refills | Status: DC

## 2016-12-30 NOTE — Progress Notes (Signed)
   Subjective:    Patient ID: Toni Kim, female    DOB: [birth year], 41 y.o.   MRN: [id number]    HPI Pt presents to the office today with right shoulder, arm, back and right leg. Pt states this is chronic but the pain has increased over the last two weeks and gradually worsening. [facility], but would like another referral to seen another ortho. Pt has taken tylenol and motrin with no relief.  PT states she has been told she had carpal tunnel years ago, and wears a brace every night.    Review of Systems  Musculoskeletal: Positive for arthralgias and back pain.  All other systems reviewed and are negative.      Objective:   Physical Exam  Constitutional: She is oriented to person, place, and time. She appears well-developed and well-nourished. No distress.  HENT:  Head: Normocephalic and atraumatic.  Right Ear: External ear normal.  Left Ear: External ear normal. Tympanic membrane is erythematous.  Mouth/Throat: Posterior oropharyngeal erythema present.  Eyes: Pupils are equal, round, and reactive to light.  Neck: Normal range of motion. Neck supple. No thyromegaly present.  Cardiovascular: Normal rate, regular rhythm, normal heart sounds and intact distal pulses.   No murmur heard. Pulmonary/Chest: Effort normal and breath sounds normal. No respiratory distress. She has no wheezes.  Abdominal: Soft. Bowel sounds are normal. She exhibits no distension. There is no tenderness.  Musculoskeletal: She exhibits no edema or tenderness.  positive tinel and phalen signs  Neurological: She is alert and oriented to person, place, and time.  Skin: Skin is warm and dry.  Psychiatric: She has a normal mood and affect. Her behavior is normal. Judgment and thought content normal.  Vitals reviewed.   BP 116/76   Pulse 98   Temp 98.1 F (36.7 C) (Oral)   Ht 5\' 5"  (1.651 m)   Wt 245 lb (111.1 kg)   BMI 40.77 kg/m      Assessment & Plan:    1. Right-sided low back pain with right-sided sciatica, unspecified chronicity - predniSONE (STERAPRED UNI-PAK 21 TAB) 10 MG (21) TBPK tablet; Use as directed  Dispense: 21 tablet; Refill: 0 - meloxicam (MOBIC) 15 MG tablet; Take 1 tablet (15 mg total) by mouth daily.  Dispense: 30 tablet; Refill: 0  2. Neck pain, chronic - predniSONE (STERAPRED UNI-PAK 21 TAB) 10 MG (21) TBPK tablet; Use as directed  Dispense: 21 tablet; Refill: 0 - meloxicam (MOBIC) 15 MG tablet; Take 1 tablet (15 mg total) by mouth daily.  Dispense: 30 tablet; Refill: 0 - AMB referral to orthopedics  3. Carpal tunnel syndrome on right - Ambulatory referral to Hand Surgery - predniSONE (STERAPRED UNI-PAK 21 TAB) 10 MG (21) TBPK tablet; Use as directed  Dispense: 21 tablet; Refill: 0 - meloxicam (MOBIC) 15 MG tablet; Take 1 tablet (15 mg total) by mouth daily.  Dispense: 30 tablet; Refill: 0  Rest Ice  Continue to wear wrist brace at night Referral pending RTO prn   Jannifer Rodneyhristy Wren Gallaga, FNP

## 2016-12-30 NOTE — Patient Instructions (Signed)
Carpal Tunnel Release Carpal tunnel release is a surgical procedure to relieve numbness and pain in your hand that are caused by carpal tunnel syndrome. Your carpal tunnel is a narrow, hollow space in your wrist. It passes between your wrist bones and a band of connective tissue (transverse carpal ligament). The nerve that supplies most of your hand (median nerve) passes through this space, and so do the connections between your fingers and the muscles of your arm (tendons). Carpal tunnel syndrome makes this space swell and become narrow, and this causes pain and numbness. In carpal tunnel release surgery, a surgeon cuts through the transverse carpal ligament to make more room in the carpal tunnel space. You may have this surgery if other types of treatment have not worked. Tell a health care provider about:  Any allergies you have.  All medicines you are taking, including vitamins, herbs, eye drops, creams, and over-the-counter medicines.  Any problems you or family members have had with anesthetic medicines.  Any blood disorders you have.  Any surgeries you have had.  Any medical conditions you have. What are the risks? Generally, this is a safe procedure. However, problems may occur, including:  Bleeding.  Infection.  Injury to the median nerve.  Need for additional surgery.  What happens before the procedure?  Ask your health care provider about: ? Changing or stopping your regular medicines. This is especially important if you are taking diabetes medicines or blood thinners. ? Taking medicines such as aspirin and ibuprofen. These medicines can thin your blood. Do not take these medicines before your procedure if your health care provider instructs you not to.  Do not eat or drink anything after midnight on the night before the procedure or as directed by your health care provider.  Plan to have someone take you home after the procedure. What happens during the  procedure?  An IV tube may be inserted into a vein.  You will be given one of the following: ? A medicine that numbs the wrist area (local anesthetic). You may also be given a medicine to make you relax (sedative). ? A medicine that makes you go to sleep (general anesthetic).  Your arm, hand, and wrist will be cleaned with a germ-killing solution (antiseptic).  Your surgeon will make a surgical cut (incision) over the palm side of your wrist. The surgeon will pull aside the skin of your wrist to expose the carpal tunnel space.  The surgeon will cut the transverse carpal ligament.  The edges of the incision will be closed with stitches (sutures) or staples.  A bandage (dressing) will be placed over your wrist and wrapped around your hand and wrist. What happens after the procedure?  You may spend some time in a recovery area.  Your blood pressure, heart rate, breathing rate, and blood oxygen level will be monitored often until the medicines you were given have worn off.  You will likely have some pain. You will be given pain medicine.  You may need to wear a splint or a wrist brace over your dressing. This information is not intended to replace advice given to you by your health care provider. Make sure you discuss any questions you have with your health care provider. Document Released: 08/23/2003 Document Revised: 11/08/2015 Document Reviewed: 01/18/2014 Elsevier Interactive Patient Education  2017 [employer]  

## 2017-01-08 ENCOUNTER — Encounter (INDEPENDENT_AMBULATORY_CARE_PROVIDER_SITE_OTHER): Payer: Self-pay | Admitting: Orthopaedic Surgery

## 2017-01-08 ENCOUNTER — Ambulatory Visit (INDEPENDENT_AMBULATORY_CARE_PROVIDER_SITE_OTHER): Payer: Medicaid Other

## 2017-01-08 ENCOUNTER — Ambulatory Visit (INDEPENDENT_AMBULATORY_CARE_PROVIDER_SITE_OTHER): Payer: Medicaid Other | Admitting: Orthopaedic Surgery

## 2017-01-08 VITALS — BP 139/95 | HR 99 | Ht 64.0 in | Wt 245.0 lb

## 2017-01-08 DIAGNOSIS — G8929 Other chronic pain: Secondary | ICD-10-CM

## 2017-01-08 DIAGNOSIS — M4722 Other spondylosis with radiculopathy, cervical region: Secondary | ICD-10-CM | POA: Diagnosis not present

## 2017-01-08 DIAGNOSIS — M545 Low back pain: Secondary | ICD-10-CM | POA: Diagnosis not present

## 2017-01-08 DIAGNOSIS — M542 Cervicalgia: Secondary | ICD-10-CM | POA: Diagnosis not present

## 2017-01-08 NOTE — Progress Notes (Addendum)
Office Visit Note   Patient: Toni Kim           Date of Birth: 02/19/1976           MRN: [id number] Visit Date: 01/08/2017              Requested by: Junie SpencerHawks, Christy A, FNP [address] [place], [place] [address] PCP: Junie SpencerHawks, Christy A, FNP   Assessment & Plan: Visit Diagnoses:  1. Neck pain   2. Chronic right-sided low back pain, with sciatica presence unspecified     Plan: Patient's been trying to avoid surgery for the last year but her symptoms increased today clear neck pain with hand numbness pain that wakes her up at night. She's had anti-inflammatories and muscle relaxants, prednisone dose pack, back to therapy exercises taught by physical therapy without relief. Last MRI was over a year ago and unable be visualized. Will obtain a new cervical MRI scan also AFTER scan for review.  Follow-Up Instructions: No Follow-up on file.   Orders:  Orders Placed This Encounter  Procedures  . XR Cervical Spine 2 or 3 views  . XR Lumbar Spine 2-3 Views   No orders of the defined types were placed in this encounter.     Procedures: No procedures performed   Clinical Data: No additional findings.   Subjective: Chief Complaint  Patient presents with  . Neck - Pain  . Lower Back - Pain    HPI 41-year-old female seen with chronic neck pain worsened chronic back pain. She one point she was scheduled for surgery last summer but did not proceed. She's had multiple epidurals for lower back she's been through therapy in the past. She had lumbar spine surgery by Dr. Jeral FruitBotero 4-5 years ago which helped her back and may have been a microdiscectomy. Likely at L4-5 based on plain radiographs today. States her neck is worse than her back pain radiates into right hand with numbness. She has pain with posterior cervical pain and associated headaches. It bothers her on a daily basis. She's taken anti-inflammatories and also been on a prednisone pack in the past with temporary relief. She  works only occasionally part-time doing some cooking.  Review of Systems Poss for history of chronic back and neck pain also hypertension. She's had a previous back surgery by Dr. Jeral FruitBotero about 5 years ago., IUD removal also previous C-section. Patient was one half pack per day. The assistant positive for arthritis hypertension acid reflux chronic neck and back pain.   Objective: Vital Signs: BP (!) 139/95   Pulse 99   Ht 5\' 4"  (1.626 m)   Wt 245 lb (111.1 kg)   BMI 42.05 kg/m   Physical Exam  Constitutional: She is oriented to person, place, and time. She appears well-developed.  HENT:  Head: Normocephalic.  Right Ear: External ear normal.  Left Ear: External ear normal.  Eyes: Pupils are equal, round, and reactive to light.  Neck: No tracheal deviation present. No thyromegaly present.  Cardiovascular: Normal rate.   Pulmonary/Chest: Effort normal.  Abdominal: Soft.  Increased abdominal BMI  Musculoskeletal:  Patient good IR and ER  hip range of motion , neg  straight leg raising 90 knee and ankle jerk are 2+ and symmetrical. Upper Shoney's 1+ and symmetrical she has significant brachial plexus tenderness positive Spurling right and left. No atrophy of the upper extremities. No impingement of the shoulders. There are some enlargement of her neck but no palpable thyroid enlargement. Pain with both  flexion-extension of her neck positive Spurling on the right than left negative were made.  Neurological: She is alert and oriented to person, place, and time.  Skin: Skin is warm and dry.  Psychiatric: She has a normal mood and affect. Her behavior is normal.    Ortho Exam  Specialty Comments:  No specialty comments available.  Imaging: No results found.   PMFS History: Patient Active Problem List   Diagnosis Date Noted  . Essential hypertension 05/08/2015  . Neck pain, chronic 09/18/2014  . Back pain 12/01/2013   Past Medical History:  Diagnosis Date  . Arthritis   . H/O  hiatal hernia     Family History  Problem Relation Age of Onset  . Heart disease Mother        MI    Past Surgical History:  Procedure Laterality Date  . CESAREAN SECTION    . LUMBAR LAMINECTOMY/DECOMPRESSION MICRODISCECTOMY Right 12/21/2012   Procedure: LUMBAR LAMINECTOMY/DECOMPRESSION MICRODISCECTOMY 2 level;  Surgeon: Karn CassisErnesto M Botero, MD;  Location: [facility];  Service: Neurosurgery;  Laterality: Right;  Right Lumbar three-four,Lumbar four-five Microdiskectomy  . SPINE SURGERY     Social History   Occupational History  . Not on file.   Social History Main Topics  . Smoking status: Current Every Day Smoker    Packs/day: 1.00    Years: 10.00  . Smokeless tobacco: Never Used  . Alcohol use No  . Drug use: Yes    Types: Marijuana  . Sexual activity: Not on file

## 2017-01-08 NOTE — Addendum Note (Signed)
Addended by: Rogers SeedsYEATTS, Keyairra Kolinski M on: 01/08/2017 04:05 PM   Modules accepted: Orders

## 2017-01-19 ENCOUNTER — Ambulatory Visit (HOSPITAL_COMMUNITY)
Admission: RE | Admit: 2017-01-19 | Discharge: 2017-01-19 | Disposition: A | Discharge status: Home / Self Care | Payer: Medicaid Other | Source: Ambulatory Visit | Attending: Orthopaedic Surgery | Admitting: Orthopaedic Surgery

## 2017-01-19 DIAGNOSIS — M4722 Other spondylosis with radiculopathy, cervical region: Secondary | ICD-10-CM | POA: Insufficient documentation

## 2017-01-19 DIAGNOSIS — M4802 Spinal stenosis, cervical region: Secondary | ICD-10-CM | POA: Insufficient documentation

## 2017-01-22 ENCOUNTER — Ambulatory Visit (INDEPENDENT_AMBULATORY_CARE_PROVIDER_SITE_OTHER): Payer: Medicaid Other | Admitting: Orthopaedic Surgery

## 2017-01-22 VITALS — BP 118/76 | HR 91 | Ht 65.0 in | Wt 240.0 lb

## 2017-01-22 DIAGNOSIS — M4722 Other spondylosis with radiculopathy, cervical region: Secondary | ICD-10-CM | POA: Diagnosis not present

## 2017-01-22 DIAGNOSIS — M542 Cervicalgia: Secondary | ICD-10-CM | POA: Diagnosis not present

## 2017-01-22 NOTE — Progress Notes (Signed)
Office Visit Note   Patient: Toni FrameLisa Bethea           Date of Birth: [birth year]           MRN: [id number] Visit Date: 01/22/2017              Requested by: Junie SpencerHawks, Christy A, FNP [address] [place], [place] [address] PCP: Junie SpencerHawks, Christy A, FNP   Assessment & Plan: Visit Diagnoses:  1. Cervicalgia   2. Other spondylosis with radiculopathy, cervical region   3.   Cervical stenosis, combined congenital and acquired, C4-5 and C5-6  Plan: MRI scan is reviewed with her gave her copy reports she has combination of acquired and congenital cervical stenosis at C4-5 and C5-6 with anterior cord impingement at those levels. Plan will be to level cervical fusion C4-5 C5-6 overnight stay in the hospital. Surgery discussed use of postop collar discussed. Questions were listed and answered she understands request we proceed.  Follow-Up Instructions: No Follow-up on file.   Orders:  No orders of the defined types were placed in this encounter.  No orders of the defined types were placed in this encounter.     Procedures: No procedures performed   Clinical Data: No additional findings.   Subjective: Chief Complaint  Patient presents with  . Neck - Follow-up, Pain    HPI 41 year old female returns with ongoing problems with chronic neck pain. She's been on anti-inflammatories, muscle relaxants, prednisone pack, therapy exercise program or symptoms of gradually progressed over the last 2 years. She has pain with activities of daily living. She works part-time doing some cooking. Patient will scheduled for surgery over a year ago but has tried to avoid the procedure states at this time pain progressed where she cannot take it any longer.  Review of Systems is for previous lumbar surgery about 5 years ago with Dr. Jeral FruitBotero. Previous C-section. She is half pack per day smoker positive for hypertension and chronic neck pain. Negative for CVA, seizures, stroke.   Objective: Vital Signs: BP  118/76   Pulse 91   Ht 5\' 5"  (1.651 m)   Wt 240 lb (108.9 kg)   BMI 39.94 kg/m   Physical Exam  Constitutional: She is oriented to person, place, and time. She appears well-developed.  HENT:  Head: Normocephalic.  Right Ear: External ear normal.  Left Ear: External ear normal.  Eyes: Pupils are equal, round, and reactive to light.  Neck: No tracheal deviation present. No thyromegaly present.  Cardiovascular: Normal rate.   Pulmonary/Chest: Effort normal.  Abdominal: Soft.  Musculoskeletal:  Healed lumbar incision negative SLR raising 90 right and left. Knee and ankle jerk intact. Increased pain with cervical compression relief with distraction.  Neurological: She is alert and oriented to person, place, and time.  Skin: Skin is warm and dry.  Psychiatric: She has a normal mood and affect. Her behavior is normal.    Ortho Exam positive Spurling right and left. Increased pain with cervical flexion chin 2 finger breast in the chest. Upper semi-reflexes are 2+ and symmetrical. No evidence of nerve entrapment ulnar nerve at the elbows median nerve the wrist. Thyroid is normal palpation. Negative her made.  Specialty Comments:  No specialty comments available.  Imaging: Study Result   CLINICAL DATA:  41 y/o F; neck pain with bilateral arm numbness for 1 year.  EXAM: MRI CERVICAL SPINE WITHOUT CONTRAST  TECHNIQUE: Multiplanar, multisequence MR imaging of the cervical spine was performed. No intravenous contrast was administered.  COMPARISON:  09/18/2014 cervical radiographs  FINDINGS: Alignment: Mild reversal of cervical curvature with apex at C4-5. No listhesis.  Vertebrae: Minimal degenerative endplate edema within C4 vertebral body. No evidence for discitis, fracture, or suspicious bone lesion.  Cord: No abnormal cord signal.  Posterior Fossa, vertebral arteries, paraspinal tissues: Extensive partially visualized paranasal sinus disease.  Disc levels:   Congenital cervical canal stenosis.  C2-3: Small right central disc protrusion. Ventral thecal sac effacement and mild canal stenosis. No significant foraminal stenosis.  C3-4: Small diffuse disc bulge with mild canal stenosis. No significant foraminal stenosis.  C4-5: Small disc bulge with right central disc protrusion. Right anterior cord impingement and flattening. Moderate canal stenosis. Mild foraminal stenosis.  C5-6: Diffuse disc bulge with uncovertebral and facet hypertrophy. Moderate right and moderate to severe left foraminal stenosis. Anterior cord impingement with mild flattening. Moderate canal stenosis.  C6-7: Small central disc protrusion with ventral thecal sac effacement and mild canal stenosis. No significant foraminal stenosis.  C7-T1: No significant disc displacement, foraminal narrowing, or canal stenosis.  IMPRESSION: 1. 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 2. Multilevel mild canal stenosis with moderate C4-5 and C5-6 canal stenosis. Anterior cord impingement at the C4-5 and C5-6 levels. 3. C4-5 mild foraminal stenosis and moderate right / moderate to severe left C5-6 foraminal stenosis.   Electronically Signed   By: Mitzi Hansen M.D.   On: 01/19/2017 15:55      PMFS History: Patient Active Problem List   Diagnosis Date Noted  . Other spondylosis with radiculopathy, cervical region 01/08/2017  . Essential hypertension 05/08/2015  . Neck pain, chronic 09/18/2014  . Back pain 12/01/2013   Past Medical History:  Diagnosis Date  . Arthritis   . H/O hiatal hernia     Family History  Problem Relation Age of Onset  . Heart disease Mother        MI    Past Surgical History:  Procedure Laterality Date  . CESAREAN SECTION    . LUMBAR LAMINECTOMY/DECOMPRESSION MICRODISCECTOMY Right 12/21/2012   Procedure: LUMBAR LAMINECTOMY/DECOMPRESSION MICRODISCECTOMY 2  level;  Surgeon: Karn Cassis, MD;  Location: [facility];  Service: Neurosurgery;  Laterality: Right;  Right Lumbar three-four,Lumbar four-five Microdiskectomy  . SPINE SURGERY     Social History   Occupational History  . Not on file.   Social History Main Topics  . Smoking status: Current Every Day Smoker    Packs/day: 1.00    Years: 10.00  . Smokeless tobacco: Never Used  . Alcohol use No  . Drug use: Yes    Types: Marijuana  . Sexual activity: Not on file

## 2017-01-26 ENCOUNTER — Other Ambulatory Visit: Payer: Self-pay | Admitting: Family

## 2017-01-26 DIAGNOSIS — G8929 Other chronic pain: Secondary | ICD-10-CM

## 2017-01-26 DIAGNOSIS — G5601 Carpal tunnel syndrome, right upper limb: Secondary | ICD-10-CM

## 2017-01-26 DIAGNOSIS — M542 Cervicalgia: Secondary | ICD-10-CM

## 2017-01-26 DIAGNOSIS — M5441 Lumbago with sciatica, right side: Secondary | ICD-10-CM

## 2017-02-16 ENCOUNTER — Other Ambulatory Visit: Payer: Self-pay | Admitting: Family

## 2017-02-16 DIAGNOSIS — M5441 Lumbago with sciatica, right side: Secondary | ICD-10-CM

## 2017-02-16 DIAGNOSIS — G8929 Other chronic pain: Secondary | ICD-10-CM

## 2017-02-16 DIAGNOSIS — G5601 Carpal tunnel syndrome, right upper limb: Secondary | ICD-10-CM

## 2017-02-16 DIAGNOSIS — M542 Cervicalgia: Secondary | ICD-10-CM

## 2017-02-17 ENCOUNTER — Encounter: Payer: Self-pay | Admitting: Family

## 2017-02-17 ENCOUNTER — Ambulatory Visit (INDEPENDENT_AMBULATORY_CARE_PROVIDER_SITE_OTHER): Payer: Medicaid Other | Admitting: Family

## 2017-02-17 VITALS — BP 129/88 | HR 93 | Temp 97.0°F | Ht 65.0 in | Wt 245.0 lb

## 2017-02-17 DIAGNOSIS — G8929 Other chronic pain: Secondary | ICD-10-CM

## 2017-02-17 DIAGNOSIS — M4722 Other spondylosis with radiculopathy, cervical region: Secondary | ICD-10-CM | POA: Diagnosis not present

## 2017-02-17 DIAGNOSIS — M542 Cervicalgia: Secondary | ICD-10-CM | POA: Diagnosis not present

## 2017-02-17 DIAGNOSIS — M549 Dorsalgia, unspecified: Secondary | ICD-10-CM

## 2017-02-17 DIAGNOSIS — M4802 Spinal stenosis, cervical region: Secondary | ICD-10-CM

## 2017-02-17 NOTE — Progress Notes (Signed)
   Subjective:    Patient ID: Toni Kim, female    DOB: 07/19/1975, 41 y.o.   MRN: [id number]  HPI Pt presents to the office today for chronic neck and back pain. Pt has forms to be filled out for disability and has a lawyer she is seeing for this.   Pt states the pain is constant pain 9 out 10. Pt states sitting for long periods of times and or standing makes the pain worse. Pt states her right hand had numbness and tingling.   Pt is followed by Ortho. and has surgery to her neck that is scheduled for 10/12. Pt has cervical stenosis C4-5 and C5-6. Pt will have cervical fusion C4-5 C5-6 with anterior cord impingement.      Review of Systems  Musculoskeletal: Positive for arthralgias, back pain, gait problem, myalgias and neck pain.  All other systems reviewed and are negative.      Objective:   Physical Exam  Constitutional: She is oriented to person, place, and time. She appears well-developed and well-nourished. No distress.  HENT:  Head: Normocephalic.  Eyes: Pupils are equal, round, and reactive to light.  Neck: Normal range of motion. Neck supple. No thyromegaly present.  Cardiovascular: Normal rate, regular rhythm, normal heart sounds and intact distal pulses.   No murmur heard. Pulmonary/Chest: Effort normal and breath sounds normal. No respiratory distress. She has no wheezes.  Abdominal: Soft. Bowel sounds are normal. She exhibits no distension. There is no tenderness.  Musculoskeletal: She exhibits edema and tenderness.  Full ROM of neck, but tenderness present with flexion and extension. Numbness and tingling present in right hand  Neurological: She is alert and oriented to person, place, and time.  Skin: Skin is warm and dry.  Psychiatric: She has a normal mood and affect. Her behavior is normal. Judgment and thought content normal.  Vitals reviewed.     BP 129/88   Pulse 93   Temp (!) 97 F (36.1 C) (Oral)   Ht 5\' 5"  (1.651 m)   Wt 245 lb (111.1 kg)   BMI  40.77 kg/m      Assessment & Plan:  1. Chronic neck and back pain  2. Other spondylosis with radiculopathy, cervical region   3. Neck pain, chronic  4. Cervical stenosis of spine   Keep all Ortho follow up Completed forms with pt about her ability to walk, stand, and do her job duties RTO prn   Jannifer Rodneyhristy Kallan Bischoff, FNP     Jannifer Rodneyhristy Kolbee Stallman, [place]

## 2017-02-17 NOTE — Patient Instructions (Signed)
Spinal Stenosis Spinal stenosis occurs when the open space (spinal canal) between the bones of your spine (vertebrae) narrows, putting pressure on the spinal cord or nerves. What are the causes? This condition is caused by areas of bone pushing into the central canals of your vertebrae. This condition may be present at birth (congenital), or it may be caused by:  Arthritic deterioration of your vertebrae (spinal degeneration). This usually starts around age 50.  Injury or trauma to the spine.  Tumors in the spine.  Calcium deposits in the spine.  What are the signs or symptoms? Symptoms of this condition include:  Pain in the neck or back that is generally worse with activities, particularly when standing and walking.  Numbness, tingling, hot or cold sensations, weakness, or weariness in your legs.  Pain going up and down the leg (sciatica).  Frequent episodes of falling.  A foot-slapping gait that leads to muscle weakness.  In more serious cases, you may develop:  Problemspassing stool or passing urine.  Difficulty having sex.  Loss of feeling in part or all of your leg.  Symptoms may come on slowly and get worse over time. How is this diagnosed? This condition is diagnosed based on your medical history and a physical exam. Tests will also be done, such as:  MRI.  CT scan.  X-ray.  How is this treated? Treatment for this condition often focuses on managing your pain and any other symptoms. Treatment may include:  Practicing good posture to lessen pressure on your nerves.  Exercising to strengthen muscles, build endurance, improve balance, and maintain good joint movement (range of motion).  Losing weight, if needed.  Taking medicines to reduce swelling, inflammation, or pain.  Assistive devices, such as a corset or brace.  In some cases, surgery may be needed. The most common procedure is decompression laminectomy. This is done to remove excess bone that  puts pressure on your nerve roots. Follow these instructions at home: Managing pain, stiffness, and swelling  Do all exercises and stretches as told by your health care provider.  Practice good posture. If you were given a brace or a corset, wear it as told by your health care provider.  Do not do any activities that cause pain. Ask your health care provider what activities are safe for you.  Do not lift anything that is heavier than 10 lb (4.5 kg) or the limit that your health care provider tells you.  Maintain a healthy weight. Talk with your health care provider if you need help losing weight.  If directed, apply heat to the affected area as often as told by your health care provider. Use the heat source that your health care provider recommends, such as a moist heat pack or a heating pad. ? Place a towel between your skin and the heat source. ? Leave the heat on for 20-30 minutes. ? Remove the heat if your skin turns bright red. This is especially important if you are not able to feel pain, heat, or cold. You may have a greater risk of getting burned. General instructions  Take over-the-counter and prescription medicines only as told by your health care provider.  Do not use any products that contain nicotine or tobacco, such as cigarettes and e-cigarettes. If you need help quitting, ask your health care provider.  Eat a healthy diet. This includes plenty of fruits and vegetables, whole grains, and low-fat (lean) protein.  Keep all follow-up visits as told by your health care provider.   This is important. Contact a health care provider if:  Your symptoms do not get better or they get worse.  You have a fever. Get help right away if:  You have new or worse pain in your neck or upper back.  You have severe pain that cannot be controlled with medicines.  You are dizzy.  You have vision problems, blurred vision, or double vision.  You have a severe headache that is worse when  you stand.  You have nausea or you vomit.  You develop new or worse numbness or tingling in your back or legs.  You have pain, redness, swelling, or warmth in your arm or leg. Summary  Spinal stenosis occurs when the open space (spinal canal) between the bones of your spine (vertebrae) narrows. This narrowing puts pressure on the spinal cord or nerves.  Spinal stenosis can cause numbness, weakness, or pain in the neck, back, and legs.  This condition may be caused by a birth defect, arthritic deterioration of your vertebrae, injury, tumors, or calcium deposits.  This condition is usually diagnosed with MRIs, CT scans, and X-rays. This information is not intended to replace advice given to you by your health care provider. Make sure you discuss any questions you have with your health care provider. Document Released: 08/23/2003 Document Revised: 05/07/2016 Document Reviewed: 05/07/2016 Elsevier Interactive Patient Education  2017 Elsevier Inc.  

## 2017-03-05 ENCOUNTER — Ambulatory Visit (INDEPENDENT_AMBULATORY_CARE_PROVIDER_SITE_OTHER): Payer: Medicaid Other | Admitting: Orthopaedic Surgery

## 2017-03-24 NOTE — Pre-Procedure Instructions (Addendum)
Toni Kim  03/24/2017      [id number] - [place], [place] - [address] [place] [address] Phone: [phone] Fax: [phone]    Your procedure is scheduled on March 27, 2017.  Report to [facility] Admitting at 530 AM.  Call this number if you have problems the morning of surgery:  [phone]   Remember:  Do not eat food or drink liquids after midnight.  Take these medicines the morning of surgery with A SIP OF WATER acetaminophen (tylenol), albuterol inhaler (bring inhaler with you).   7 days prior to surgery STOP taking any meloxicam (mobic), Aspirin (unless otherwise instructed by your surgeon), Aleve, Naproxen, Ibuprofen, Motrin, Advil, Goody's, BC's, all herbal medications, fish oil, and all vitamins  Continue all other medications as instructed by your physician except follow the above medication instructions before surgery   Do not wear jewelry, make-up or nail polish.  Do not wear lotions, powders, or perfumes, or deoderant.  Do not shave 48 hours prior to surgery.    Do not bring valuables to the hospital.  [facility] is not responsible for any belongings or valuables.  Contacts, dentures or bridgework may not be worn into surgery.  Leave your suitcase in the car.  After surgery it may be brought to your room.  For patients admitted to the hospital, discharge time will be determined by your treatment team.  Patients discharged the day of surgery will not be allowed to drive home.    Special instructions:   [place]- Preparing For Surgery  Before surgery, you can play an important role. Because skin is not sterile, your skin needs to be as free of germs as possible. You can reduce the number of germs on your skin by washing with CHG (chlorahexidine gluconate) Soap before surgery.  CHG is an antiseptic cleaner which kills germs and bonds with the skin to continue killing germs even after  washing.  Please do not use if you have an allergy to CHG or antibacterial soaps. If your skin becomes reddened/irritated stop using the CHG.  Do not shave (including legs and underarms) for at least 48 hours prior to first CHG shower. It is OK to shave your face.  Please follow these instructions carefully.   1. Shower the NIGHT BEFORE SURGERY and the MORNING OF SURGERY with CHG.   2. If you chose to wash your hair, wash your hair first as usual with your normal shampoo.  3. After you shampoo, rinse your hair and body thoroughly to remove the shampoo.  4. Use CHG as you would any other liquid soap. You can apply CHG directly to the skin and wash gently with a scrungie or a clean washcloth.   5. Apply the CHG Soap to your body ONLY FROM THE NECK DOWN.  Do not use on open wounds or open sores. Avoid contact with your eyes, ears, mouth and genitals (private parts). Wash genitals (private parts) with your normal soap.  USE REGULAR SHAMPOO AND CONDITIONER FOR HAIR USE REGULAR SOAP FOR FACE AND PRIVATE AREA  6. Wash thoroughly, paying special attention to the area where your surgery will be performed.  7. Thoroughly rinse your body with warm water from the neck down.  8. DO NOT shower/wash with your normal soap after using and rinsing off the CHG Soap.  9. Pat yourself dry with a CLEAN TOWEL and WASH CLOTH  10. Wear CLEAN PAJAMAS to bed the  night before surgery, wear comfortable clothes the morning of surgery  11. Place CLEAN SHEETS on your bed the night of your first shower and DO NOT SLEEP WITH PETS.    Day of Surgery: Do not apply any deodorants/lotions. Please wear clean clothes to the hospital/surgery center.     Please read over the following fact sheets that you were given. Pain Booklet, Coughing and Deep Breathing, MRSA Information and Surgical Site Infection Prevention

## 2017-03-25 ENCOUNTER — Encounter (HOSPITAL_COMMUNITY): Payer: Self-pay

## 2017-03-25 ENCOUNTER — Encounter (HOSPITAL_COMMUNITY)
Admission: RE | Admit: 2017-03-25 | Discharge: 2017-03-25 | Disposition: A | Discharge status: Home / Self Care | Payer: Medicaid Other | Source: Ambulatory Visit | Attending: Orthopaedic Surgery | Admitting: Orthopaedic Surgery

## 2017-03-25 DIAGNOSIS — Z01810 Encounter for preprocedural cardiovascular examination: Secondary | ICD-10-CM | POA: Insufficient documentation

## 2017-03-25 DIAGNOSIS — I10 Essential (primary) hypertension: Secondary | ICD-10-CM | POA: Insufficient documentation

## 2017-03-25 DIAGNOSIS — Z01812 Encounter for preprocedural laboratory examination: Secondary | ICD-10-CM | POA: Insufficient documentation

## 2017-03-25 HISTORY — DX: Essential (primary) hypertension: I10

## 2017-03-25 HISTORY — DX: Headache, unspecified: R51.9

## 2017-03-25 HISTORY — DX: Headache: R51

## 2017-03-25 HISTORY — DX: Gastro-esophageal reflux disease without esophagitis: K21.9

## 2017-03-25 LAB — CBC
HCT: 44.9 % (ref 36.0–46.0)
Hemoglobin: 14.9 g/dL (ref 12.0–15.0)
MCH: 26.7 pg (ref 26.0–34.0)
MCHC: 33.2 g/dL (ref 30.0–36.0)
MCV: 80.3 fL (ref 78.0–100.0)
PLATELETS: 267 10*3/uL (ref 150–400)
RBC: 5.59 MIL/uL — AB (ref 3.87–5.11)
RDW: 14.6 % (ref 11.5–15.5)
WBC: 12.2 10*3/uL — AB (ref 4.0–10.5)

## 2017-03-25 LAB — PROTIME-INR
INR: 0.91
PROTHROMBIN TIME: 12.2 s (ref 11.4–15.2)

## 2017-03-25 LAB — COMPREHENSIVE METABOLIC PANEL
ALT: 18 U/L (ref 14–54)
AST: 21 U/L (ref 15–41)
Albumin: 3.7 g/dL (ref 3.5–5.0)
Alkaline Phosphatase: 116 U/L (ref 38–126)
Anion gap: 10 (ref 5–15)
BUN: 8 mg/dL (ref 6–20)
CHLORIDE: 104 mmol/L (ref 101–111)
CO2: 22 mmol/L (ref 22–32)
CREATININE: 1 mg/dL (ref 0.44–1.00)
Calcium: 9 mg/dL (ref 8.9–10.3)
GFR calc Af Amer: >60 mL/min (ref >60)
GFR calc non Af Amer: >60 mL/min (ref >60)
GLUCOSE: 79 mg/dL (ref 65–99)
Potassium: 4.1 mmol/L (ref 3.5–5.1)
SODIUM: 136 mmol/L (ref 135–145)
Total Bilirubin: 0.6 mg/dL (ref 0.3–1.2)
Total Protein: 7.5 g/dL (ref 6.5–8.1)

## 2017-03-25 LAB — HCG, SERUM, QUALITATIVE: PREG SERUM: NEGATIVE

## 2017-03-25 LAB — URINALYSIS, ROUTINE W REFLEX MICROSCOPIC
Bilirubin Urine: NEGATIVE
Glucose, UA: NEGATIVE mg/dL
Hgb urine dipstick: NEGATIVE
KETONES UR: NEGATIVE mg/dL
LEUKOCYTES UA: NEGATIVE
NITRITE: NEGATIVE
PH: 5 (ref 5.0–8.0)
PROTEIN: NEGATIVE mg/dL
Specific Gravity, Urine: 1.012 (ref 1.005–1.030)

## 2017-03-25 LAB — SURGICAL PCR SCREEN
MRSA, PCR: NEGATIVE
STAPHYLOCOCCUS AUREUS: POSITIVE — AB

## 2017-03-25 LAB — APTT: aPTT: 29 seconds (ref 24–36)

## 2017-03-25 NOTE — Progress Notes (Signed)
PCP: Jannifer Rodney, FNP--saw recently for annual physical Cardiologist: denies  EKG: today  Pt instructed no smoking 24 hours prior to surgery.  Patient denies shortness of breath, fever, cough, and chest pain at PAT appointment.  Patient verbalized understanding of instructions provided today at the PAT appointment.  Patient asked to review instructions at home and day of surgery.

## 2017-03-26 NOTE — H&P (Signed)
Toni Kim is an 41 y.o. female.   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 Progressively worsening symptoms. Failed conservative treatment.  Past Medical History:  Diagnosis Date  . Arthritis   . GERD (gastroesophageal reflux disease)   . H/O hiatal hernia   . Headache    migranes  . Hypertension     Past Surgical History:  Procedure Laterality Date  . BACK SURGERY    . CESAREAN SECTION    . LUMBAR LAMINECTOMY/DECOMPRESSION MICRODISCECTOMY Right 12/21/2012   Procedure: LUMBAR LAMINECTOMY/DECOMPRESSION MICRODISCECTOMY 2 level;  Surgeon: Floyce Stakes, MD;  Location: [facility];  Service: Neurosurgery;  Laterality: Right;  Right Lumbar three-four,Lumbar four-five Microdiskectomy  . SPINE SURGERY    . TUBAL LIGATION    . WISDOM TOOTH EXTRACTION      Family History  Problem Relation Age of Onset  . Heart disease Mother        MI   Social History:  reports that she has been smoking Cigarettes.  She has a 5.00 pack-year smoking history. She has never used smokeless tobacco. She reports that she uses drugs, including Marijuana. She reports that she does not drink alcohol.  Allergies: No Known Allergies  No prescriptions prior to admission.    Results for orders placed or performed during the hospital encounter of 03/25/17 (from the past 48 hour(s))  Urinalysis, Routine w reflex microscopic     Status: None   Collection Time: 03/25/17  9:41 AM  Result Value Ref Range   Color, Urine YELLOW YELLOW   APPearance CLEAR CLEAR   Specific Gravity, Urine 1.012 1.005 - 1.030   pH 5.0 5.0 - 8.0   Glucose, UA NEGATIVE NEGATIVE mg/dL   Hgb urine dipstick NEGATIVE NEGATIVE   Bilirubin Urine NEGATIVE NEGATIVE   Ketones, ur NEGATIVE NEGATIVE mg/dL   Protein, ur NEGATIVE NEGATIVE mg/dL   Nitrite NEGATIVE NEGATIVE   Leukocytes, UA NEGATIVE NEGATIVE   Surgical pcr screen     Status: Abnormal   Collection Time: 03/25/17  9:41 AM  Result Value Ref Range   MRSA, PCR NEGATIVE NEGATIVE   Staphylococcus aureus POSITIVE (A) NEGATIVE    Comment: (NOTE) The Xpert SA Assay (FDA approved for NASAL specimens in patients 61 years of age and older), is one component of a comprehensive surveillance program. It is not intended to diagnose infection nor to guide or monitor treatment.   hCG, serum, qualitative     Status: None   Collection Time: 03/25/17  9:42 AM  Result Value Ref Range   Preg, Serum NEGATIVE NEGATIVE    Comment:        THE SENSITIVITY OF THIS METHODOLOGY IS >10 mIU/mL.   APTT     Status: None   Collection Time: 03/25/17  9:42 AM  Result Value Ref Range   aPTT 29 24 - 36 seconds  CBC     Status: Abnormal   Collection Time: 03/25/17  9:42 AM  Result Value Ref Range   WBC 12.2 (H) 4.0 - 10.5 K/uL   RBC 5.59 (H) 3.87 - 5.11 MIL/uL   Hemoglobin 14.9 12.0 - 15.0 g/dL   HCT 44.9 36.0 - 46.0 %   MCV 80.3 78.0 - 100.0 fL   MCH 26.7 26.0 - 34.0 pg   MCHC 33.2 30.0 - 36.0 g/dL   RDW 14.6 11.5 - 15.5 %   Platelets 267 150 -  400 K/uL  Comprehensive metabolic panel     Status: None   Collection Time: 03/25/17  9:42 AM  Result Value Ref Range   Sodium 136 135 - 145 mmol/L   Potassium 4.1 3.5 - 5.1 mmol/L   Chloride 104 101 - 111 mmol/L   CO2 22 22 - 32 mmol/L   Glucose, Bld 79 65 - 99 mg/dL   BUN 8 6 - 20 mg/dL   Creatinine, Ser 1.00 0.44 - 1.00 mg/dL   Calcium 9.0 8.9 - 10.3 mg/dL   Total Protein 7.5 6.5 - 8.1 g/dL   Albumin 3.7 3.5 - 5.0 g/dL   AST 21 15 - 41 U/L   ALT 18 14 - 54 U/L   Alkaline Phosphatase 116 38 - 126 U/L   Total Bilirubin 0.6 0.3 - 1.2 mg/dL   GFR calc non Af Amer >60 >60 mL/min   GFR calc Af Amer >60 >60 mL/min    Comment: (NOTE) The eGFR has been calculated using the CKD EPI equation. This calculation has not been validated in all clinical situations. eGFR's persistently <60 mL/min signify  possible Chronic Kidney Disease.    Anion gap 10 5 - 15  Protime-INR     Status: None   Collection Time: 03/25/17  9:42 AM  Result Value Ref Range   Prothrombin Time 12.2 11.4 - 15.2 seconds   INR 0.91    No results found.  ROS No current cardiac pulmonary GI GU issues Last menstrual period 03/13/2017. Physical Exam  Constitutional: She is oriented to person, place, and time. She appears well-developed. No distress.  HENT:  Head: Normocephalic and atraumatic.  Eyes: Pupils are equal, round, and reactive to light. EOM are normal.  GI: She exhibits no distension.  Musculoskeletal: She exhibits tenderness.  Decrease cervical spine range of motion. Positive Spurling test. Positive brachial plexus or trapezius tenderness.  Neurological: She is alert and oriented to person, place, and time.  Skin: Skin is warm and dry.  Psychiatric: She has a normal mood and affect.     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 Surgical procedure along with possible risks and, patient discussed. All questions answered. Benjiman Core, PA-C 03/26/2017, 3:16 PM

## 2017-03-26 NOTE — Anesthesia Preprocedure Evaluation (Addendum)
Anesthesia Evaluation  Patient identified by MRN, date of birth, ID band Patient awake, Patient confused and Patient unresponsive    Reviewed: Allergy & Precautions, H&P , NPO status , Patient's Chart, lab work & pertinent test results, reviewed documented beta blocker date and time   Airway Mallampati: II  TM Distance: >3 FB Neck ROM: full   Comment: Pain with neck flexion Dental  (+) Teeth Intact, Dental Advisory Given   Pulmonary Current Smoker (did NOT smoke DOS),    Pulmonary exam normal        Cardiovascular hypertension, Pt. on medications Normal cardiovascular exam  ECG: NSR, rate 75   Neuro/Psych  Headaches,    GI/Hepatic hiatal hernia, GERD  Medicated and Controlled,  Endo/Other  Morbid obesity  Renal/GU      Musculoskeletal   Abdominal (+) + obese,   Peds  Hematology   Anesthesia Other Findings hcg negative  Reproductive/Obstetrics                            Anesthesia Physical  Anesthesia Plan  ASA: III  Anesthesia Plan: General   Post-op Pain Management:    Induction: Intravenous  PONV Risk Score and Plan: 2 and Ondansetron, Dexamethasone and Midazolam  Airway Management Planned: Oral ETT  Additional Equipment:   Intra-op Plan:   Post-operative Plan: Extubation in OR  Informed Consent: I have reviewed the patients History and Physical, chart, labs and discussed the procedure including the risks, benefits and alternatives for the proposed anesthesia with the patient or authorized representative who has indicated his/her understanding and acceptance.   Dental advisory given  Plan Discussed with: CRNA  Anesthesia Plan Comments:         Anesthesia Quick Evaluation

## 2017-03-27 ENCOUNTER — Encounter (HOSPITAL_COMMUNITY): Payer: Self-pay

## 2017-03-27 ENCOUNTER — Ambulatory Visit (HOSPITAL_COMMUNITY): Payer: Medicaid Other | Admitting: Certified Registered"

## 2017-03-27 ENCOUNTER — Observation Stay (HOSPITAL_COMMUNITY)
Admission: RE | Admit: 2017-03-27 | Discharge: 2017-03-28 | Disposition: A | Discharge status: Home / Self Care | Payer: Medicaid Other | Source: Ambulatory Visit | Attending: Orthopaedic Surgery | Admitting: Orthopaedic Surgery

## 2017-03-27 ENCOUNTER — Ambulatory Visit (HOSPITAL_COMMUNITY): Payer: Medicaid Other

## 2017-03-27 ENCOUNTER — Encounter (HOSPITAL_COMMUNITY)
Admission: RE | Disposition: A | Discharge status: Home / Self Care | Payer: Self-pay | Source: Ambulatory Visit | Attending: Orthopaedic Surgery

## 2017-03-27 DIAGNOSIS — F17210 Nicotine dependence, cigarettes, uncomplicated: Secondary | ICD-10-CM | POA: Insufficient documentation

## 2017-03-27 DIAGNOSIS — K219 Gastro-esophageal reflux disease without esophagitis: Secondary | ICD-10-CM | POA: Insufficient documentation

## 2017-03-27 DIAGNOSIS — M1990 Unspecified osteoarthritis, unspecified site: Secondary | ICD-10-CM | POA: Insufficient documentation

## 2017-03-27 DIAGNOSIS — Z79899 Other long term (current) drug therapy: Secondary | ICD-10-CM | POA: Diagnosis not present

## 2017-03-27 DIAGNOSIS — M4802 Spinal stenosis, cervical region: Secondary | ICD-10-CM | POA: Diagnosis not present

## 2017-03-27 DIAGNOSIS — M47892 Other spondylosis, cervical region: Secondary | ICD-10-CM

## 2017-03-27 DIAGNOSIS — M4722 Other spondylosis with radiculopathy, cervical region: Principal | ICD-10-CM | POA: Insufficient documentation

## 2017-03-27 DIAGNOSIS — Z6841 Body Mass Index (BMI) 40.0 and over, adult: Secondary | ICD-10-CM | POA: Diagnosis not present

## 2017-03-27 DIAGNOSIS — I10 Essential (primary) hypertension: Secondary | ICD-10-CM | POA: Insufficient documentation

## 2017-03-27 DIAGNOSIS — Z419 Encounter for procedure for purposes other than remedying health state, unspecified: Secondary | ICD-10-CM

## 2017-03-27 HISTORY — PX: ANTERIOR CERVICAL DECOMP/DISCECTOMY FUSION: SHX1161

## 2017-03-27 SURGERY — ANTERIOR CERVICAL DECOMPRESSION/DISCECTOMY FUSION 2 LEVELS
Anesthesia: General

## 2017-03-27 MED ORDER — LIDOCAINE 2% (20 MG/ML) 5 ML SYRINGE
INTRAMUSCULAR | Status: DC | PRN
  Administered 2017-03-27 (×2): 60 mg via INTRAVENOUS

## 2017-03-27 MED ORDER — OXYCODONE-ACETAMINOPHEN 7.5-325 MG PO TABS: 1.0000 | ORAL_TABLET | ORAL | 0 refills | Status: DC | PRN

## 2017-03-27 MED ORDER — SODIUM CHLORIDE 0.9% FLUSH: 3.0000 mL | INTRAVENOUS | Status: DC | PRN

## 2017-03-27 MED ORDER — 0.9 % SODIUM CHLORIDE (POUR BTL) OPTIME
TOPICAL | Status: DC | PRN
  Administered 2017-03-27: 1000 mL

## 2017-03-27 MED ORDER — OXYCODONE HCL 5 MG PO TABS
5.0000 mg | ORAL_TABLET | ORAL | Status: DC | PRN
  Administered 2017-03-27 – 2017-03-28 (×5): 10 mg via ORAL
  Filled 2017-03-27 (×5): qty 2

## 2017-03-27 MED ORDER — BUPIVACAINE-EPINEPHRINE 0.25% -1:200000 IJ SOLN
INTRAMUSCULAR | Status: DC | PRN
  Administered 2017-03-27: 10 mL

## 2017-03-27 MED ORDER — KETOROLAC TROMETHAMINE 30 MG/ML IJ SOLN
INTRAMUSCULAR | Status: AC
  Administered 2017-03-27: 30 mg via INTRAVENOUS
  Filled 2017-03-27: qty 1

## 2017-03-27 MED ORDER — ROCURONIUM BROMIDE 10 MG/ML (PF) SYRINGE
PREFILLED_SYRINGE | INTRAVENOUS | Status: DC | PRN
  Administered 2017-03-27: 20 mg via INTRAVENOUS
  Administered 2017-03-27: 70 mg via INTRAVENOUS

## 2017-03-27 MED ORDER — ACETAMINOPHEN 650 MG RE SUPP: 650.0000 mg | RECTAL | Status: DC | PRN

## 2017-03-27 MED ORDER — HYDROMORPHONE HCL 1 MG/ML IJ SOLN
INTRAMUSCULAR | Status: AC
  Administered 2017-03-27: 0.5 mg via INTRAVENOUS
  Filled 2017-03-27: qty 1

## 2017-03-27 MED ORDER — FENTANYL CITRATE (PF) 250 MCG/5ML IJ SOLN
INTRAMUSCULAR | Status: AC
  Filled 2017-03-27: qty 5

## 2017-03-27 MED ORDER — OXYCODONE HCL 5 MG PO TABS
ORAL_TABLET | ORAL | Status: AC
  Filled 2017-03-27: qty 1

## 2017-03-27 MED ORDER — MIDAZOLAM HCL 2 MG/2ML IJ SOLN
INTRAMUSCULAR | Status: AC
  Filled 2017-03-27: qty 2

## 2017-03-27 MED ORDER — ACETAMINOPHEN 325 MG PO TABS
650.0000 mg | ORAL_TABLET | ORAL | Status: DC | PRN
  Administered 2017-03-27: 650 mg via ORAL
  Filled 2017-03-27: qty 2

## 2017-03-27 MED ORDER — OXYCODONE HCL 5 MG PO TABS
5.0000 mg | ORAL_TABLET | Freq: Once | ORAL | Status: AC | PRN
  Administered 2017-03-27: 5 mg via ORAL

## 2017-03-27 MED ORDER — METHOCARBAMOL 500 MG PO TABS: 500.0000 mg | ORAL_TABLET | Freq: Four times a day (QID) | ORAL | 0 refills | Status: DC | PRN

## 2017-03-27 MED ORDER — LACTATED RINGERS IV SOLN
INTRAVENOUS | Status: DC | PRN
  Administered 2017-03-27 (×2): via INTRAVENOUS

## 2017-03-27 MED ORDER — ALBUTEROL SULFATE (2.5 MG/3ML) 0.083% IN NEBU: 3.0000 mL | INHALATION_SOLUTION | Freq: Four times a day (QID) | RESPIRATORY_TRACT | Status: DC | PRN

## 2017-03-27 MED ORDER — METHOCARBAMOL 1000 MG/10ML IJ SOLN
500.0000 mg | Freq: Four times a day (QID) | INTRAMUSCULAR | Status: DC | PRN
  Filled 2017-03-27: qty 5

## 2017-03-27 MED ORDER — DEXAMETHASONE SODIUM PHOSPHATE 10 MG/ML IJ SOLN
INTRAMUSCULAR | Status: DC | PRN
  Administered 2017-03-27: 10 mg via INTRAVENOUS

## 2017-03-27 MED ORDER — ONDANSETRON HCL 4 MG PO TABS: 4.0000 mg | ORAL_TABLET | Freq: Four times a day (QID) | ORAL | Status: DC | PRN

## 2017-03-27 MED ORDER — DEXAMETHASONE SODIUM PHOSPHATE 10 MG/ML IJ SOLN
INTRAMUSCULAR | Status: AC
  Filled 2017-03-27: qty 1

## 2017-03-27 MED ORDER — BUPIVACAINE-EPINEPHRINE (PF) 0.25% -1:200000 IJ SOLN
INTRAMUSCULAR | Status: AC
  Filled 2017-03-27: qty 30

## 2017-03-27 MED ORDER — SUGAMMADEX SODIUM 200 MG/2ML IV SOLN
INTRAVENOUS | Status: AC
  Filled 2017-03-27: qty 2

## 2017-03-27 MED ORDER — MENTHOL 3 MG MT LOZG: 1.0000 | LOZENGE | OROMUCOSAL | Status: DC | PRN

## 2017-03-27 MED ORDER — ONDANSETRON HCL 4 MG/2ML IJ SOLN
INTRAMUSCULAR | Status: AC
  Filled 2017-03-27: qty 2

## 2017-03-27 MED ORDER — SODIUM CHLORIDE 0.9% FLUSH
3.0000 mL | Freq: Two times a day (BID) | INTRAVENOUS | Status: DC
  Administered 2017-03-27: 3 mL via INTRAVENOUS

## 2017-03-27 MED ORDER — HYDROMORPHONE HCL 1 MG/ML IJ SOLN
INTRAMUSCULAR | Status: AC
  Filled 2017-03-27: qty 1

## 2017-03-27 MED ORDER — SUGAMMADEX SODIUM 200 MG/2ML IV SOLN
INTRAVENOUS | Status: DC | PRN
  Administered 2017-03-27: 200 mg via INTRAVENOUS

## 2017-03-27 MED ORDER — ONDANSETRON HCL 4 MG/2ML IJ SOLN: 4.0000 mg | Freq: Four times a day (QID) | INTRAMUSCULAR | Status: DC | PRN

## 2017-03-27 MED ORDER — PHENYLEPHRINE 40 MCG/ML (10ML) SYRINGE FOR IV PUSH (FOR BLOOD PRESSURE SUPPORT)
PREFILLED_SYRINGE | INTRAVENOUS | Status: DC | PRN
  Administered 2017-03-27: 120 ug via INTRAVENOUS
  Administered 2017-03-27 (×2): 80 ug via INTRAVENOUS

## 2017-03-27 MED ORDER — CEFAZOLIN SODIUM-DEXTROSE 1-4 GM/50ML-% IV SOLN
1.0000 g | Freq: Three times a day (TID) | INTRAVENOUS | Status: AC
  Administered 2017-03-27 (×2): 1 g via INTRAVENOUS
  Filled 2017-03-27 (×2): qty 50

## 2017-03-27 MED ORDER — KETOROLAC TROMETHAMINE 30 MG/ML IJ SOLN
30.0000 mg | Freq: Once | INTRAMUSCULAR | Status: AC
  Administered 2017-03-27: 30 mg via INTRAVENOUS

## 2017-03-27 MED ORDER — CEFAZOLIN SODIUM-DEXTROSE 2-4 GM/100ML-% IV SOLN
INTRAVENOUS | Status: AC
  Filled 2017-03-27: qty 100

## 2017-03-27 MED ORDER — MIDAZOLAM HCL 5 MG/5ML IJ SOLN
INTRAMUSCULAR | Status: DC | PRN
  Administered 2017-03-27: 2 mg via INTRAVENOUS

## 2017-03-27 MED ORDER — PHENOL 1.4 % MT LIQD
1.0000 | OROMUCOSAL | Status: DC | PRN
  Administered 2017-03-27: 1 via OROMUCOSAL
  Filled 2017-03-27: qty 177

## 2017-03-27 MED ORDER — HYDROCHLOROTHIAZIDE 12.5 MG PO CAPS
12.5000 mg | ORAL_CAPSULE | Freq: Every day | ORAL | Status: DC
  Administered 2017-03-27: 12.5 mg via ORAL
  Filled 2017-03-27: qty 1

## 2017-03-27 MED ORDER — LIDOCAINE 2% (20 MG/ML) 5 ML SYRINGE
INTRAMUSCULAR | Status: AC
  Filled 2017-03-27: qty 10

## 2017-03-27 MED ORDER — DOCUSATE SODIUM 100 MG PO CAPS
100.0000 mg | ORAL_CAPSULE | Freq: Two times a day (BID) | ORAL | Status: DC
  Administered 2017-03-27: 100 mg via ORAL
  Filled 2017-03-27: qty 1

## 2017-03-27 MED ORDER — CHLORHEXIDINE GLUCONATE 4 % EX LIQD: 60.0000 mL | Freq: Once | CUTANEOUS | Status: DC

## 2017-03-27 MED ORDER — POLYETHYLENE GLYCOL 3350 17 G PO PACK: 17.0000 g | PACK | Freq: Every day | ORAL | Status: DC

## 2017-03-27 MED ORDER — PROMETHAZINE HCL 25 MG/ML IJ SOLN
6.2500 mg | INTRAMUSCULAR | Status: DC | PRN
  Administered 2017-03-27: 12.5 mg via INTRAVENOUS

## 2017-03-27 MED ORDER — PROMETHAZINE HCL 25 MG/ML IJ SOLN
INTRAMUSCULAR | Status: AC
  Filled 2017-03-27: qty 1

## 2017-03-27 MED ORDER — PROPOFOL 10 MG/ML IV BOLUS
INTRAVENOUS | Status: AC
  Filled 2017-03-27: qty 20

## 2017-03-27 MED ORDER — PROPOFOL 10 MG/ML IV BOLUS
INTRAVENOUS | Status: DC | PRN
  Administered 2017-03-27: 170 mg via INTRAVENOUS

## 2017-03-27 MED ORDER — HYDROMORPHONE HCL 1 MG/ML IJ SOLN
0.2500 mg | INTRAMUSCULAR | Status: DC | PRN
  Administered 2017-03-27 (×3): 0.5 mg via INTRAVENOUS

## 2017-03-27 MED ORDER — ONDANSETRON HCL 4 MG/2ML IJ SOLN
INTRAMUSCULAR | Status: DC | PRN
  Administered 2017-03-27: 4 mg via INTRAVENOUS

## 2017-03-27 MED ORDER — PHENYLEPHRINE 40 MCG/ML (10ML) SYRINGE FOR IV PUSH (FOR BLOOD PRESSURE SUPPORT)
PREFILLED_SYRINGE | INTRAVENOUS | Status: AC
  Filled 2017-03-27: qty 10

## 2017-03-27 MED ORDER — CEFAZOLIN SODIUM-DEXTROSE 2-4 GM/100ML-% IV SOLN
2.0000 g | INTRAVENOUS | Status: AC
  Administered 2017-03-27: 2 g via INTRAVENOUS

## 2017-03-27 MED ORDER — SODIUM CHLORIDE 0.9 % IV SOLN: INTRAVENOUS | Status: DC

## 2017-03-27 MED ORDER — OXYCODONE HCL 5 MG/5ML PO SOLN: 5.0000 mg | Freq: Once | ORAL | Status: AC | PRN

## 2017-03-27 MED ORDER — HYDROMORPHONE HCL 1 MG/ML IJ SOLN: 0.5000 mg | INTRAMUSCULAR | Status: DC | PRN

## 2017-03-27 MED ORDER — FENTANYL CITRATE (PF) 250 MCG/5ML IJ SOLN
INTRAMUSCULAR | Status: DC | PRN
  Administered 2017-03-27 (×2): 50 ug via INTRAVENOUS
  Administered 2017-03-27: 100 ug via INTRAVENOUS
  Administered 2017-03-27: 50 ug via INTRAVENOUS

## 2017-03-27 MED ORDER — METHOCARBAMOL 500 MG PO TABS
500.0000 mg | ORAL_TABLET | Freq: Four times a day (QID) | ORAL | Status: DC | PRN
  Administered 2017-03-27 – 2017-03-28 (×3): 500 mg via ORAL
  Filled 2017-03-27 (×3): qty 1

## 2017-03-27 SURGICAL SUPPLY — 53 items
BENZOIN TINCTURE PRP APPL 2/3 (GAUZE/BANDAGES/DRESSINGS) ×3 IMPLANT
BIT DRILL SKYLINE 12MM (BIT) ×1 IMPLANT
BLADE CLIPPER SURG (BLADE) IMPLANT
BONE CERV LORDOTIC 14.5X12X6 (Bone Implant) ×6 IMPLANT
BUR ROUND FLUTED 4 SOFT TCH (BURR) IMPLANT
BUR ROUND FLUTED 4MM SOFT TCH (BURR)
CLOSURE STERI-STRIP 1/2X4 (GAUZE/BANDAGES/DRESSINGS) ×1
CLOSURE WOUND 1/2 X4 (GAUZE/BANDAGES/DRESSINGS) ×1
CLSR STERI-STRIP ANTIMIC 1/2X4 (GAUZE/BANDAGES/DRESSINGS) ×2 IMPLANT
COLLAR CERV LO CONTOUR FIRM DE (SOFTGOODS) ×3 IMPLANT
CORDS BIPOLAR (ELECTRODE) ×3 IMPLANT
COVER SURGICAL LIGHT HANDLE (MISCELLANEOUS) ×3 IMPLANT
CRADLE DONUT ADULT HEAD (MISCELLANEOUS) ×3 IMPLANT
DRAPE C-ARM 42X72 X-RAY (DRAPES) ×3 IMPLANT
DRAPE HALF SHEET 40X57 (DRAPES) ×3 IMPLANT
DRAPE MICROSCOPE LEICA (MISCELLANEOUS) ×3 IMPLANT
DRILL BIT SKYLINE 12MM (BIT) ×2
DURAPREP 6ML APPLICATOR 50/CS (WOUND CARE) ×3 IMPLANT
ELECT COATED BLADE 2.86 ST (ELECTRODE) ×3 IMPLANT
ELECT REM PT RETURN 9FT ADLT (ELECTROSURGICAL) ×3
ELECTRODE REM PT RTRN 9FT ADLT (ELECTROSURGICAL) ×1 IMPLANT
EVACUATOR 1/8 PVC DRAIN (DRAIN) ×3 IMPLANT
GAUZE SPONGE 4X4 12PLY STRL (GAUZE/BANDAGES/DRESSINGS) ×3 IMPLANT
GLOVE BIOGEL PI IND STRL 8 (GLOVE) ×2 IMPLANT
GLOVE BIOGEL PI INDICATOR 8 (GLOVE) ×4
GLOVE ORTHO TXT STRL SZ7.5 (GLOVE) ×6 IMPLANT
GOWN STRL REUS W/ TWL LRG LVL3 (GOWN DISPOSABLE) ×1 IMPLANT
GOWN STRL REUS W/ TWL XL LVL3 (GOWN DISPOSABLE) ×1 IMPLANT
GOWN STRL REUS W/TWL 2XL LVL3 (GOWN DISPOSABLE) ×3 IMPLANT
GOWN STRL REUS W/TWL LRG LVL3 (GOWN DISPOSABLE) ×2
GOWN STRL REUS W/TWL XL LVL3 (GOWN DISPOSABLE) ×2
HEAD HALTER (SOFTGOODS) ×3 IMPLANT
HEMOSTAT SURGICEL 2X14 (HEMOSTASIS) IMPLANT
KIT BASIN OR (CUSTOM PROCEDURE TRAY) ×3 IMPLANT
KIT ROOM TURNOVER OR (KITS) ×3 IMPLANT
MANIFOLD NEPTUNE II (INSTRUMENTS) ×3 IMPLANT
NEEDLE 25GX 5/8IN NON SAFETY (NEEDLE) ×3 IMPLANT
NS IRRIG 1000ML POUR BTL (IV SOLUTION) ×3 IMPLANT
PACK ORTHO CERVICAL (CUSTOM PROCEDURE TRAY) ×3 IMPLANT
PAD ARMBOARD 7.5X6 YLW CONV (MISCELLANEOUS) ×6 IMPLANT
PATTIES SURGICAL .5 X.5 (GAUZE/BANDAGES/DRESSINGS) IMPLANT
PIN TEMP SKYLINE THREADED (PIN) ×3 IMPLANT
PLATE TWO LEVEL SKYLINE 30MM (Plate) ×3 IMPLANT
RESTRAINT LIMB HOLDER UNIV (RESTRAINTS) ×3 IMPLANT
SCREW VARIABLE SELF TAP 12MM (Screw) ×18 IMPLANT
STRIP CLOSURE SKIN 1/2X4 (GAUZE/BANDAGES/DRESSINGS) ×2 IMPLANT
SURGIFLO W/THROMBIN 8M KIT (HEMOSTASIS) IMPLANT
SUT BONE WAX W31G (SUTURE) ×3 IMPLANT
SUT VIC AB 3-0 X1 27 (SUTURE) ×3 IMPLANT
SUT VICRYL 4-0 PS2 18IN ABS (SUTURE) ×6 IMPLANT
TOWEL OR 17X24 6PK STRL BLUE (TOWEL DISPOSABLE) ×3 IMPLANT
TOWEL OR 17X26 10 PK STRL BLUE (TOWEL DISPOSABLE) ×3 IMPLANT
TRAY FOLEY CATH SILVER 16FR (SET/KITS/TRAYS/PACK) IMPLANT

## 2017-03-27 NOTE — Anesthesia Postprocedure Evaluation (Signed)
Anesthesia Post Note  Patient: Kenni Newton  Procedure(s) Performed: C4-5, C5-6 ANTERIOR CERVICAL DISCECTOMY AND FUSION, ALLOGRAFT, PLATE (N/A )     Patient location during evaluation: PACU Anesthesia Type: General Level of consciousness: awake and alert Pain management: pain level controlled Vital Signs Assessment: post-procedure vital signs reviewed and stable Respiratory status: spontaneous breathing, nonlabored ventilation, respiratory function stable and patient connected to nasal cannula oxygen Cardiovascular status: blood pressure returned to baseline and stable Postop Assessment: no apparent nausea or vomiting Anesthetic complications: no    Last Vitals:  Vitals:   03/27/17 1225 03/27/17 1300  BP: (!) 139/100 (!) 152/102  Pulse: 86 97  Resp: 15 18  Temp:  36.8 C  SpO2: 98% 97%    Last Pain:  Vitals:   03/27/17 1125  TempSrc:   PainSc: Asleep                 Ryan P Ellender

## 2017-03-27 NOTE — H&P (Signed)
HISTORY AND PHYSICAL EXAM              Patient: Toni Kim                                 Date of Birth: [birth year]                                                   MRN: [id number]                                                                      Requested by: Junie Spencer, FNP [address] [place], [place] [address] PCP: Junie Spencer, FNP   Assessment & Plan: Visit Diagnoses:  1. Cervicalgia   2. Other spondylosis with radiculopathy, cervical region   3.   Cervical stenosis, combined congenital and acquired, C4-5 and C5-6  Plan: MRI scan is reviewed with her gave her copy reports she has combination of acquired and congenital cervical stenosis at C4-5 and C5-6 with anterior cord impingement at those levels. Plan will be to level cervical fusion C4-5 C5-6 overnight stay in the hospital. Surgery discussed use of postop collar discussed. Questions were illecited and answered she understands  and request we proceed.  Follow-Up Instructions: No Follow-up on file.   Orders:  No orders of the defined types were placed in this encounter.  No orders of the defined types were placed in this encounter.     Procedures: No procedures performed   Clinical Data: No additional findings.   Subjective: Chief Complaint  Patient presents with  . Neck - Follow-up, Pain    HPI 41 year old female returns with ongoing problems with chronic neck pain. She's been on anti-inflammatories, muscle relaxants, prednisone pack, therapy exercise program or symptoms of gradually progressed over the last 2 years. She has pain with activities of daily living. She works part-time doing some cooking. Patient will scheduled for surgery over a year ago but has tried to avoid the procedure states at this time pain progressed where she cannot take it any longer.  Review of Systems is for previous lumbar surgery about 5 years ago with Dr. Jeral Fruit. Previous C-section. She is half pack  per day smoker positive for hypertension and chronic neck pain. Negative for CVA, seizures, stroke.   Objective: Vital Signs: BP 118/76   Pulse 91   Ht  (1.651 m)   Wt 240 lb (108.9 kg)   BMI 39.94 kg/m   Physical Exam  Constitutional: She is oriented to person, place, and time. She appears well-developed.  HENT:  Head: Normocephalic.  Right Ear: External ear normal.  Left Ear: External ear normal.  Eyes: Pupils are equal, round, and reactive to light.  Neck: No tracheal deviation present. No thyromegaly present.  Cardiovascular: Normal rate.   Pulmonary/Chest: Effort normal.  Abdominal: Soft.  Musculoskeletal:  Healed lumbar incision negative SLR raising 90 right and left. Knee and ankle jerk intact. Increased pain with cervical compression relief with distraction.  Neurological: She is alert and  oriented to person, place, and time.  Skin: Skin is warm and dry.  Psychiatric: She has a normal mood and affect. Her behavior is normal.    Ortho Exam positive Spurling right and left. Increased pain with cervical flexion chin 2 finger breast in the chest. Upper semi-reflexes are 2+ and symmetrical. No evidence of nerve entrapment ulnar nerve at the elbows median nerve the wrist. Thyroid is normal palpation. Negative her made.  Specialty Comments:  No specialty comments available.  Imaging: Study Result   CLINICAL DATA: 41 y/o F; neck pain with bilateral arm numbness for 1 year.  EXAM: MRI CERVICAL SPINE WITHOUT CONTRAST  TECHNIQUE: Multiplanar, multisequence MR imaging of the cervical spine was performed. No intravenous contrast was administered.  COMPARISON: 09/18/2014 cervical radiographs  FINDINGS: Alignment: Mild reversal of cervical curvature with apex at C4-5. No listhesis.  Vertebrae: Minimal degenerative endplate edema within C4 vertebral body. No evidence for discitis, fracture, or suspicious bone lesion.  Cord: No abnormal cord  signal.  Posterior Fossa, vertebral arteries, paraspinal tissues: Extensive partially visualized paranasal sinus disease.  Disc levels: Congenital cervical canal stenosis.  C2-3: Small right central disc protrusion. Ventral thecal sac effacement and mild canal stenosis. No significant foraminal stenosis.  C3-4: Small diffuse disc bulge with mild canal stenosis. No significant foraminal stenosis.  C4-5: Small disc bulge with right central disc protrusion. Right anterior cord impingement and flattening. Moderate canal stenosis. Mild foraminal stenosis.  C5-6: Diffuse disc bulge with uncovertebral and facet hypertrophy. Moderate right and moderate to severe left foraminal stenosis. Anterior cord impingement with mild flattening. Moderate canal stenosis.  C6-7: Small central disc protrusion with ventral thecal sac effacement and mild canal stenosis. No significant foraminal stenosis.  C7-T1: No significant disc displacement, foraminal narrowing, or canal stenosis.  IMPRESSION: 1. 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 2. Multilevel mild canal stenosis with moderate C4-5 and C5-6 canal stenosis. Anterior cord impingement at the C4-5 and C5-6 levels. 3. C4-5 mild foraminal stenosis and moderate right / moderate to severe left C5-6 foraminal stenosis.

## 2017-03-27 NOTE — Op Note (Signed)
Preop diagnosis C4-5, C5-6 spondylosis, stenosis  Postop diagnosis: Same  Procedure: C4-5, C5-6 anterior cervical discectomy and fusion, allograft and plate.Depuy skyline 30 mm plate 12 mm screws 6    VG2 6 mm graft 2.  Surgeon: Annell Greening M.D.  Asst. Zonia Kief PA-C medically necessary and present for decompression graft placement and plate fixation and medically necessary.  Anesthesia Gen. +7 mL Marcaine local  EBL: See anesthetic record  Procedure standard prepping and draping had halter traction without weights arm tucked sides with wrist restraints due to patient's body habitus chest the had some tincture benzoin and was taped to the waist spell. DuraPrep was used here squared with towels sterile skin marker and prominent fold 9 Steri-Drape thyroid sheet sterile Melisande at the head and half sheet above. Ancef was given prophylactically timeout procedure was completed. Incision was made at the midline extending to the left in line with a prominent fold dissection down the large spur at C5-6. Incision was midway between the 2 disc levels. C4-5 as significant narrowing and it was surgically addressed first with confirmation of the appropriate level with a crosstable C-arm after it was draped. Discectomy was performed operative microscope was brought in posterior longitudinal ligament was taken down with removal of the uncovertebral spurs decompression and removal posterior longitudinal ligament with complete decompression of the dura. 6 mm graft restored the disc space height and traction was pulled by the CRNA and as the graft was countersunk 1-2 mm. Identical procedure was repeated at C5-6. This required extensive removal of 1 cm crowning anterior osteophytes removed with the bur smoothing it down so the platelets fit flush with the anterior cortex. Disc protrusion behind the endplates were teased off complete removal posterior longitudinal ligament stripping of the uncovertebral joints and  placement a 6 mm graft countersunk 1-2 mm. 30 mm plate was checked gave appropriate size position screws were placed checked under C-arm then the final screws placed lockdown. Hemovac was placed the in line with skin incision on the left using the trocar and out technique. Through platysma sutures with Vicryl. 4-0 Vicryl subcuticular skin closure tincture benzoin Steri-Strips postop dressing soft collar was applied. Instrument count needle count was correct. Patient was neurologically intact in the recovery room.

## 2017-03-27 NOTE — Anesthesia Procedure Notes (Signed)
Procedure Name: Intubation Date/Time: 03/27/2017 7:39 AM Performed by: Freddie Breech Pre-anesthesia Checklist: Patient identified, Emergency Drugs available, Suction available and Patient being monitored Patient Re-evaluated:Patient Re-evaluated prior to induction Oxygen Delivery Method: Circle System Utilized Preoxygenation: Pre-oxygenation with 100% oxygen Induction Type: IV induction Ventilation: Mask ventilation without difficulty Laryngoscope Size: Glidescope and 4 Grade View: Grade I Tube type: Oral Tube size: 7.0 mm Number of attempts: 1 Airway Equipment and Method: Stylet,  Oral airway and LTA kit utilized Placement Confirmation: ETT inserted through vocal cords under direct vision,  positive ETCO2 and breath sounds checked- equal and bilateral Secured at: 21 cm Tube secured with: Tape Dental Injury: Teeth and Oropharynx as per pre-operative assessment

## 2017-03-27 NOTE — Transfer of Care (Signed)
Immediate Anesthesia Transfer of Care Note  Patient: Toni Kim  Procedure(s) Performed: C4-5, C5-6 ANTERIOR CERVICAL DISCECTOMY AND FUSION, ALLOGRAFT, PLATE (N/A )  Patient Location: PACU  Anesthesia Type:General  Level of Consciousness: drowsy and patient cooperative  Airway & Oxygen Therapy: Patient Spontanous Breathing and Patient connected to face mask oxygen  Post-op Assessment: Report given to RN and Post -op Vital signs reviewed and stable  Post vital signs: Reviewed and stable  Last Vitals:  Vitals:   03/27/17 0604 03/27/17 1023  BP: (!) 144/96   Pulse: 72   Resp: 20   Temp: (!) 36.3 C 36.6 C  SpO2: 100%     Last Pain:  Vitals:   03/27/17 0604  TempSrc: Oral  PainSc:          Complications: No apparent anesthesia complications

## 2017-03-27 NOTE — Interval H&P Note (Signed)
History and Physical Interval Note:  03/27/2017 7:28 AM  Toni Kim  has presented today for surgery, with the diagnosis of CERVICAL STENOSIS C4-5, C5-6  The various methods of treatment have been discussed with the patient and family. After consideration of risks, benefits and other options for treatment, the patient has consented to  Procedure(s): C4-5, C5-6 ANTERIOR CERVICAL DISCECTOMY AND FUSION, ALLOGRAFT, PLATE (N/A) as a surgical intervention .  The patient's history has been reviewed, patient examined, no change in status, stable for surgery.  I have reviewed the patient's chart and labs.  Questions were answered to the patient's satisfaction.     Eldred Manges

## 2017-03-27 NOTE — Brief Op Note (Signed)
03/27/2017  11:03 AM  PATIENT:  Toni Kim  41 y.o. female  PRE-OPERATIVE DIAGNOSIS:  CERVICAL STENOSIS C4-5, C5-6  POST-OPERATIVE DIAGNOSIS:  CERVICAL STENOSIS C4-5, C5-6  PROCEDURE:  Procedure(s): C4-5, C5-6 ANTERIOR CERVICAL DISCECTOMY AND FUSION, ALLOGRAFT, PLATE (N/A)  SURGEON:  Surgeon(s) and Role:    * Eldred Manges, MD - Primary  PHYSICIAN ASSISTANT: Zonia Kief    ANESTHESIA:   general  EBL:  Total I/O In: 1300 [I.V.:1300] Out: 100 [Blood:100]  BLOOD ADMINISTERED:none  DRAINS: hemovac  SPECIMEN:  No Specimen  DISPOSITION OF SPECIMEN:  N/A  COUNTS:  YES  TOURNIQUET:  * No tourniquets in log *  DICTATION: .Dragon Dictation  PLAN OF CARE: Admit for overnight observation  PATIENT DISPOSITION:  PACU - hemodynamically stable.

## 2017-03-28 DIAGNOSIS — M4722 Other spondylosis with radiculopathy, cervical region: Secondary | ICD-10-CM | POA: Diagnosis not present

## 2017-03-28 NOTE — Progress Notes (Signed)
Subjective: Pt stable - pain ok   Objective: Vital signs in last 24 hours: Temp:  [97.5 F (36.4 C)-98.8 F (37.1 C)] 98.6 F (37 C) (10/13 0748) Pulse Rate:  [80-105] 82 (10/13 0748) Resp:  [13-19] 16 (10/13 0748) BP: (122-173)/(74-108) 138/85 (10/13 0748) SpO2:  [95 %-100 %] 99 % (10/13 0748) Weight:  [242 lb (109.8 kg)] 242 lb (109.8 kg) (10/12 1300)  Intake/Output from previous day: 10/12 0701 - 10/13 0700 In: 1830 [P.O.:480; I.V.:1300; IV Piggyback:50] Out: 100 [Blood:100] Intake/Output this shift: Total I/O In: 240 [P.O.:240] Out: -   Exam:  Neurovascular intact Sensation intact distally  Labs:  Recent Labs  03/25/17 0942  HGB 14.9    Recent Labs  03/25/17 0942  WBC 12.2*  RBC 5.59*  HCT 44.9  PLT 267    Recent Labs  03/25/17 0942  NA 136  K 4.1  CL 104  CO2 22  BUN 8  CREATININE 1.00  GLUCOSE 79  CALCIUM 9.0    Recent Labs  03/25/17 0942  INR 0.91    Assessment/Plan: Plan dc today   G Scott Dean 03/28/2017, 8:08 AM

## 2017-03-28 NOTE — Progress Notes (Addendum)
Patient is discharged from room 3C07 at this time. Alert and in stable condition. IV site d/c'd as well as hemovac and instructions read to patient with understanding verbalized. Ambulate out of unit with all belongings at side.

## 2017-03-30 ENCOUNTER — Telehealth (INDEPENDENT_AMBULATORY_CARE_PROVIDER_SITE_OTHER): Payer: Self-pay | Admitting: Radiology

## 2017-03-30 ENCOUNTER — Telehealth: Payer: Self-pay | Admitting: Family

## 2017-03-30 DIAGNOSIS — M542 Cervicalgia: Secondary | ICD-10-CM

## 2017-03-30 NOTE — Telephone Encounter (Signed)
Pt calls in to report BP is elevated today Pt has checked BP x 2 Readings approx 150/ 105 Offered appt for evaluation Pt declined stating she had ran out of pain meds and think BP is elevated due to pain Pt will continue to monitor BP Pt will come in for appt if BP remains elevated

## 2017-03-30 NOTE — Telephone Encounter (Signed)
I called and spoke with patient. She has been unable to get her pain medication due to Medicaid needing prior approval. I entered info into [employer] and received approval confirmation (entered in message below).  She also states she was told by the hospital to remove her dressings by Tuesday. I advised that she needs to keep the dressing on until she is seen in our office for follow up. Appt was made for that visit. She states that she was only given one collar at the hospital, so she took it off to take a shower. I put another collar up front for her to pick up and advised she needs to wrap one in saran wrap and use that one in the shower. Then she can use the dry one at all other times. I did explain that she needs to keep a collar on at all times. Patient verbalized understanding.

## 2017-03-30 NOTE — Telephone Encounter (Signed)
Confirmation #: [id number] Prior Approval #: [id number] Status: APPROVED

## 2017-03-30 NOTE — Telephone Encounter (Signed)
Patient is called [facility], stating that she wasn't able to get her pain meds, and she wants to know about changing her dressing. She states that she has other questions also.

## 2017-04-01 ENCOUNTER — Telehealth (INDEPENDENT_AMBULATORY_CARE_PROVIDER_SITE_OTHER): Payer: Self-pay | Admitting: Radiology

## 2017-04-01 ENCOUNTER — Encounter (HOSPITAL_COMMUNITY): Payer: Self-pay | Admitting: Orthopaedic Surgery

## 2017-04-01 NOTE — Telephone Encounter (Signed)
I called patient to be sure she was able to get her pain medication due to getting another prior auth request today. She states that she has the Percocet, but it makes her very nauseated and feel that she is going to vomit.   Is there something that she can take for nausea with the pain med?  Patient has first post op follow up in [place] office on Thursday. I did advise Dr. Ophelia CharterYates is in surgery this afternoon and it may be tomorrow before I have an answer.

## 2017-04-01 NOTE — Telephone Encounter (Signed)
I called. Some of problem is her CTS. She will use brace during day and use tylenol and ibuprofen. If not better she will call back. She can stop percocet.  FYI

## 2017-04-02 ENCOUNTER — Ambulatory Visit (INDEPENDENT_AMBULATORY_CARE_PROVIDER_SITE_OTHER): Payer: Medicaid Other | Admitting: Orthopaedic Surgery

## 2017-04-02 ENCOUNTER — Encounter (INDEPENDENT_AMBULATORY_CARE_PROVIDER_SITE_OTHER): Payer: Self-pay | Admitting: Orthopaedic Surgery

## 2017-04-02 ENCOUNTER — Ambulatory Visit (INDEPENDENT_AMBULATORY_CARE_PROVIDER_SITE_OTHER): Payer: Medicaid Other

## 2017-04-02 VITALS — BP 124/97 | HR 114 | Ht 65.0 in | Wt 240.0 lb

## 2017-04-02 DIAGNOSIS — Z981 Arthrodesis status: Secondary | ICD-10-CM

## 2017-04-02 NOTE — Telephone Encounter (Signed)
noted 

## 2017-04-02 NOTE — Progress Notes (Signed)
   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 Incision was good Steri-Strips changed Percocets making her nauseated she tried going without it was having pain that radiates into her shoulder.  Chief Complaint:  Chief Complaint  Patient presents with  . Neck - Routine Post Op   Visit Diagnoses:  1. S/P cervical spinal fusion     Plan: Phenergan prescribed for postoperative nausea associated with the Percocet. Return 5 weeks with lateral flexion-extension x-ray out of her collar. Thenar hand is gone she still has some pain just in her right shoulder.  Follow-Up Instructions: Return in about 5 weeks (around 05/07/2017).   Orders:  Orders Placed This Encounter  Procedures  . XR Cervical Spine 2 or 3 views   No orders of the defined types were placed in this encounter.   Imaging: Xr Cervical Spine 2 Or 3 Views  Result Date: 04/02/2017 AP lateral cervical spine shows C4-5 C5-6-2 level cervical fusion allograft and plate good position plate and screws. Good graft position. Impression cervical fusion C4-5, C5-6 with plate and screws.   PMFS History: Patient Active Problem List   Diagnosis Date Noted  . Cervical spinal stenosis 03/27/2017  . Cervical stenosis of spine 02/17/2017  . Other spondylosis with radiculopathy, cervical region 01/08/2017  . Essential hypertension 05/08/2015  . Neck pain, chronic 09/18/2014  . Back pain 12/01/2013   Past Medical History:  Diagnosis Date  . Arthritis   . GERD (gastroesophageal reflux disease)   . H/O hiatal hernia   . Headache    migranes  . Hypertension     Family History  Problem Relation Age of Onset  . Heart disease Mother        MI    Past Surgical History:  Procedure Laterality Date  . ANTERIOR CERVICAL DECOMP/DISCECTOMY  FUSION N/A 03/27/2017   Procedure: C4-5, C5-6 ANTERIOR CERVICAL DISCECTOMY AND FUSION, ALLOGRAFT, PLATE;  Surgeon: Eldred MangesYates, Linley Moskal C, MD;  Location: [facility] OR;  Service: Orthopedics;  Laterality: N/A;  . BACK SURGERY    . CESAREAN SECTION    . LUMBAR LAMINECTOMY/DECOMPRESSION MICRODISCECTOMY Right 12/21/2012   Procedure: LUMBAR LAMINECTOMY/DECOMPRESSION MICRODISCECTOMY 2 level;  Surgeon: Karn CassisErnesto M Botero, MD;  Location: [facility];  Service: Neurosurgery;  Laterality: Right;  Right Lumbar three-four,Lumbar four-five Microdiskectomy  . SPINE SURGERY    . TUBAL LIGATION    . WISDOM TOOTH EXTRACTION     Social History   Occupational History  . Not on file.   Social History Main Topics  . Smoking status: Current Every Day Smoker    Packs/day: 0.50    Years: 10.00    Types: Cigarettes  . Smokeless tobacco: Never Used  . Alcohol use No  . Drug use: Yes    Types: Marijuana     Comment: last 03/23/2017-uses for pain  . Sexual activity: Not on file

## 2017-04-03 ENCOUNTER — Inpatient Hospital Stay (INDEPENDENT_AMBULATORY_CARE_PROVIDER_SITE_OTHER): Payer: Medicaid Other | Admitting: Orthopaedic Surgery

## 2017-04-14 ENCOUNTER — Telehealth (INDEPENDENT_AMBULATORY_CARE_PROVIDER_SITE_OTHER): Payer: Self-pay | Admitting: Radiology

## 2017-04-14 NOTE — Telephone Encounter (Signed)
Patient left 2 voicemails stating that she is having some numbness under her chin on her left side where her incision is from cervical spine surgery.  I attempted to reach patient but voicemail picks up stating patient is not taking calls at this time.  Will try back this afternoon.  CB [phone]

## 2017-04-15 NOTE — Discharge Summary (Signed)
Patient ID: Toni FrameLisa Kim MRN: [id number] DOB/AGE: [birth year] 41 y.o.  Admit date: 03/27/2017 Discharge date: 04/15/2017  Admission Diagnoses:  Active Problems:   Cervical spinal stenosis   Discharge Diagnoses:  Active Problems:   Cervical spinal stenosis  status post Procedure(s): C4-5, C5-6 ANTERIOR CERVICAL DISCECTOMY AND FUSION, ALLOGRAFT, PLATE  Past Medical History:  Diagnosis Date  . Arthritis   . GERD (gastroesophageal reflux disease)   . H/O hiatal hernia   . Headache    migranes  . Hypertension     Surgeries: Procedure(s): C4-5, C5-6 ANTERIOR CERVICAL DISCECTOMY AND FUSION, ALLOGRAFT, PLATE on 91/47/829510/05/2017   Consultants:   Discharged Condition: Improved  Hospital Course: Toni FrameLisa Kim is an 41 y.o. female who was admitted 03/27/2017 for operative treatment of cervical stenosis. Patient failed conservative treatments (please see the history and physical for the specifics) and had severe unremitting pain that affects sleep, daily activities and work/hobbies. After pre-op clearance, the patient was taken to the operating room on 03/27/2017 and underwent  Procedure(s): C4-5, C5-6 ANTERIOR CERVICAL DISCECTOMY AND FUSION, ALLOGRAFT, PLATE.    Patient was given perioperative antibiotics:  Anti-infectives    Start     Dose/Rate Route Frequency Ordered Stop   03/27/17 1600  ceFAZolin (ANCEF) IVPB 1 g/50 mL premix     1 g 100 mL/hr over 30 Minutes Intravenous Every 8 hours 03/27/17 1253 03/28/17 0010   03/27/17 0551  ceFAZolin (ANCEF) 2-4 GM/100ML-% IVPB    Comments:  Clovis CaoFerguson, Laura   : cabinet override      03/27/17 0551 03/27/17 0742   03/27/17 0548  ceFAZolin (ANCEF) IVPB 2g/100 mL premix     2 g 200 mL/hr over 30 Minutes Intravenous On call to O.R. 03/27/17 [id number] 03/27/17 0747       Patient was given sequential compression devices and early ambulation to prevent DVT.   Patient benefited maximally from hospital stay and there were no complications. At the  time of discharge, the patient was urinating/moving their bowels without difficulty, tolerating a regular diet, pain is controlled with oral pain medications and they have been cleared by PT/OT.   Recent vital signs: No data found.    Recent laboratory studies: No results for input(s): WBC, HGB, HCT, PLT, NA, K, CL, CO2, BUN, CREATININE, GLUCOSE, INR, CALCIUM in the last 72 hours.  Invalid input(s): PT, 2   Discharge Medications:   Allergies as of 03/28/2017   No Known Allergies     Medication List    STOP taking these medications   acetaminophen 500 MG tablet Commonly known as:  TYLENOL   ibuprofen 200 MG tablet Commonly known as:  ADVIL,MOTRIN   meloxicam 15 MG tablet Commonly known as:  MOBIC   omeprazole 20 MG capsule Commonly known as:  PRILOSEC     TAKE these medications   albuterol 108 (90 Base) MCG/ACT inhaler Commonly known as:  PROVENTIL HFA;VENTOLIN HFA Inhale 2 puffs into the lungs every 6 (six) hours as needed for wheezing or shortness of breath.   hydrochlorothiazide 12.5 MG capsule Commonly known as:  MICROZIDE TAKE 1 CAPSULE EVERY DAY What changed:  See the new instructions.   IRON PO Take 1 tablet by mouth once a week.   methocarbamol 500 MG tablet Commonly known as:  ROBAXIN Take 1 tablet (500 mg total) by mouth every 6 (six) hours as needed for muscle spasms.   oxyCODONE-acetaminophen 7.5-325 MG tablet Commonly known as:  PERCOCET Take 1 tablet by mouth every 4 (four) hours  as needed for severe pain.       Diagnostic Studies: Dg Cervical Spine 2-3 Views  Result Date: 03/27/2017 CLINICAL DATA:  C4-5 and C5-6 anterior cervical discectomy and fusion. EXAM: CERVICAL SPINE - 2-3 VIEW; DG C-ARM 61-120 MIN COMPARISON:  Cervical spine MR dated 01/19/2017. FINDINGS: AP and lateral C-arm views of the cervical spine demonstrate interbody bone plug and anterior screw and plate fusion at the C4-5 and C5-6 levels with normal alignment to the C6 level.  The more inferior portions of the cervical spine are obscured by the patient's shoulders on the lateral view. IMPRESSION: C4-5 and C5-6 fusion with normal alignment to the C6 level. Electronically Signed   By: Beckie Salts M.D.   On: 03/27/2017 10:17   Dg C-arm 1-60 Min  Result Date: 03/27/2017 CLINICAL DATA:  C4-5 and C5-6 anterior cervical discectomy and fusion. EXAM: CERVICAL SPINE - 2-3 VIEW; DG C-ARM 61-120 MIN COMPARISON:  Cervical spine MR dated 01/19/2017. FINDINGS: AP and lateral C-arm views of the cervical spine demonstrate interbody bone plug and anterior screw and plate fusion at the C4-5 and C5-6 levels with normal alignment to the C6 level. The more inferior portions of the cervical spine are obscured by the patient's shoulders on the lateral view. IMPRESSION: C4-5 and C5-6 fusion with normal alignment to the C6 level. Electronically Signed   By: Beckie Salts M.D.   On: 03/27/2017 10:17   Xr Cervical Spine 2 Or 3 Views  Result Date: 04/02/2017 AP lateral cervical spine shows C4-5 C5-6-2 level cervical fusion allograft and plate good position plate and screws. Good graft position. Impression cervical fusion C4-5, C5-6 with plate and screws.   Discharge Instructions    Call MD / Call 911    Complete by:  As directed    If you experience chest pain or shortness of breath, CALL 911 and be transported to the hospital emergency room.  If you develope a fever above 101 F, pus (white drainage) or increased drainage or redness at the wound, or calf pain, call your surgeon's office.   Constipation Prevention    Complete by:  As directed    Drink plenty of fluids.  Prune juice may be helpful.  You may use a stool softener, such as Colace (over the counter) 100 mg twice a day.  Use MiraLax (over the counter) for constipation as needed.   Diet - low sodium heart healthy    Complete by:  As directed    Increase activity slowly as tolerated    Complete by:  As directed       Follow-up  Information    Schedule an appointment as soon as possible for a visit with Eldred Manges, MD.   Specialty:  Orthopedic Surgery Why:  need return office visit one week postop.  call to schedule.  Contact information: [address] [place] [address] [phone]           Discharge Plan:  discharge to home  Disposition:     Signed: Zonia Kief for Annell Greening MD 04/15/2017, 1:41 PM

## 2017-04-17 NOTE — Telephone Encounter (Signed)
Unable to leave message for patient. Will wait for return call.

## 2017-04-23 ENCOUNTER — Other Ambulatory Visit (INDEPENDENT_AMBULATORY_CARE_PROVIDER_SITE_OTHER): Payer: Self-pay | Admitting: Surgery

## 2017-04-23 NOTE — Telephone Encounter (Signed)
Been one month since surgery , time to stop . She can use advil or tylenol .

## 2017-04-23 NOTE — Telephone Encounter (Signed)
Ok for refill? 

## 2017-04-29 ENCOUNTER — Telehealth (INDEPENDENT_AMBULATORY_CARE_PROVIDER_SITE_OTHER): Payer: Self-pay | Admitting: Orthopaedic Surgery

## 2017-04-29 NOTE — Telephone Encounter (Signed)
Patient called back stating the best number to reach her is [phone]. Patient asked for a call back as soon as possible concerning last message left.

## 2017-04-29 NOTE — Telephone Encounter (Signed)
Patient is concerned that she is still having numbness in the left side of her face, in her jaw, and down her chin. She would like to be sure that something is not wrong.  She also states that her last steri strip has come off. I advised that she must stay in the collar until see in office for follow up. She was concerned about it rubbing the incision. I advised that she could put a large band-aid over it if she felt that it was rubbing. Please advise on numbness.

## 2017-04-30 ENCOUNTER — Other Ambulatory Visit (INDEPENDENT_AMBULATORY_CARE_PROVIDER_SITE_OTHER): Payer: Self-pay | Admitting: Surgery

## 2017-04-30 NOTE — Telephone Encounter (Signed)
Methocarbamol refill request 

## 2017-04-30 NOTE — Telephone Encounter (Signed)
I called discussed.  

## 2017-04-30 NOTE — Telephone Encounter (Signed)
noted 

## 2017-05-14 ENCOUNTER — Ambulatory Visit (INDEPENDENT_AMBULATORY_CARE_PROVIDER_SITE_OTHER): Payer: Medicaid Other | Admitting: Orthopaedic Surgery

## 2017-05-14 ENCOUNTER — Encounter (INDEPENDENT_AMBULATORY_CARE_PROVIDER_SITE_OTHER): Payer: Self-pay | Admitting: Orthopaedic Surgery

## 2017-05-14 ENCOUNTER — Ambulatory Visit (INDEPENDENT_AMBULATORY_CARE_PROVIDER_SITE_OTHER): Payer: Medicaid Other

## 2017-05-14 DIAGNOSIS — Z981 Arthrodesis status: Secondary | ICD-10-CM

## 2017-05-14 NOTE — Progress Notes (Signed)
   Post-Op Visit Note   Patient: Toni Kim           Date of Birth: Sep 22, 1975           MRN: [id number] Visit Date: 05/14/2017 PCP: Junie SpencerHawks, Christy A, FNP   Assessment & Plan: 2 level cervical fusion C4-5 C5-6.  Incision is well-healed.  She has had a little bit of numbness adjacent to her jaw on the left side.  She still has tingling in her fingers more on the right hand than left in the radial 3 fingers.  She does have a history of carpal tunnel positive test many years back but it was only mild at that time.  Trace today demonstrates good healing at the C4-5 level but C5-6 is lagging behind and has a few millimeters of motion.  She will continue with a soft collar until Christmas she can remove it after Christmas I will repeat lateral flexion-extension x-ray on return in 6 weeks.  Chief Complaint:  Chief Complaint  Patient presents with  . Neck - Routine Post Op   Visit Diagnoses:  1. S/P cervical spinal fusion     Plan: Return in 6 weeks repeat lateral cervical spine maximal flexion-extension x-ray.  Follow-Up Instructions: No Follow-up on file.   Orders:  Orders Placed This Encounter  Procedures  . XR Cervical Spine 2 or 3 views   No orders of the defined types were placed in this encounter.   Imaging: No results found.  PMFS History: Patient Active Problem List   Diagnosis Date Noted  . S/P cervical spinal fusion 05/14/2017  . Cervical spinal stenosis 03/27/2017  . Cervical stenosis of spine 02/17/2017  . Other spondylosis with radiculopathy, cervical region 01/08/2017  . Essential hypertension 05/08/2015  . Neck pain, chronic 09/18/2014  . Back pain 12/01/2013   Past Medical History:  Diagnosis Date  . Arthritis   . GERD (gastroesophageal reflux disease)   . H/O hiatal hernia   . Headache    migranes  . Hypertension     Family History  Problem Relation Age of Onset  . Heart disease Mother        MI    Past Surgical History:  Procedure Laterality  Date  . ANTERIOR CERVICAL DECOMP/DISCECTOMY FUSION N/A 03/27/2017   Procedure: C4-5, C5-6 ANTERIOR CERVICAL DISCECTOMY AND FUSION, ALLOGRAFT, PLATE;  Surgeon: Eldred MangesYates, Emy Angevine C, MD;  Location: [facility] OR;  Service: Orthopedics;  Laterality: N/A;  . BACK SURGERY    . CESAREAN SECTION    . LUMBAR LAMINECTOMY/DECOMPRESSION MICRODISCECTOMY Right 12/21/2012   Procedure: LUMBAR LAMINECTOMY/DECOMPRESSION MICRODISCECTOMY 2 level;  Surgeon: Karn CassisErnesto M Botero, MD;  Location: [facility];  Service: Neurosurgery;  Laterality: Right;  Right Lumbar three-four,Lumbar four-five Microdiskectomy  . SPINE SURGERY    . TUBAL LIGATION    . WISDOM TOOTH EXTRACTION     Social History   Occupational History  . Not on file  Tobacco Use  . Smoking status: Current Every Day Smoker    Packs/day: 0.50    Years: 10.00    Pack years: 5.00    Types: Cigarettes  . Smokeless tobacco: Never Used  Substance and Sexual Activity  . Alcohol use: No  . Drug use: Yes    Types: Marijuana    Comment: last 03/23/2017-uses for pain  . Sexual activity: Not on file

## 2017-05-20 ENCOUNTER — Encounter (INDEPENDENT_AMBULATORY_CARE_PROVIDER_SITE_OTHER): Payer: Self-pay | Admitting: Orthopaedic Surgery

## 2017-05-31 ENCOUNTER — Encounter (HOSPITAL_COMMUNITY): Payer: Self-pay | Admitting: *Deleted

## 2017-05-31 ENCOUNTER — Other Ambulatory Visit: Payer: Self-pay

## 2017-05-31 ENCOUNTER — Emergency Department (HOSPITAL_COMMUNITY): Payer: Medicaid Other

## 2017-05-31 ENCOUNTER — Emergency Department (HOSPITAL_COMMUNITY)
Admission: EM | Admit: 2017-05-31 | Discharge: 2017-06-01 | Disposition: A | Discharge status: Home / Self Care | Payer: Medicaid Other | Attending: Emergency Medicine | Admitting: Emergency Medicine

## 2017-05-31 DIAGNOSIS — R55 Syncope and collapse: Secondary | ICD-10-CM | POA: Diagnosis not present

## 2017-05-31 DIAGNOSIS — Z79899 Other long term (current) drug therapy: Secondary | ICD-10-CM | POA: Insufficient documentation

## 2017-05-31 DIAGNOSIS — F17210 Nicotine dependence, cigarettes, uncomplicated: Secondary | ICD-10-CM | POA: Insufficient documentation

## 2017-05-31 DIAGNOSIS — I10 Essential (primary) hypertension: Secondary | ICD-10-CM | POA: Diagnosis not present

## 2017-05-31 LAB — BASIC METABOLIC PANEL
Anion gap: 10 (ref 5–15)
BUN: 10 mg/dL (ref 6–20)
CHLORIDE: 101 mmol/L (ref 101–111)
CO2: 26 mmol/L (ref 22–32)
CREATININE: 1.07 mg/dL — AB (ref 0.44–1.00)
Calcium: 9.5 mg/dL (ref 8.9–10.3)
Glucose, Bld: 110 mg/dL — ABNORMAL HIGH (ref 65–99)
POTASSIUM: 3.5 mmol/L (ref 3.5–5.1)
SODIUM: 137 mmol/L (ref 135–145)

## 2017-05-31 LAB — CBC
HCT: 45.2 % (ref 36.0–46.0)
Hemoglobin: 15.2 g/dL — ABNORMAL HIGH (ref 12.0–15.0)
MCH: 27.2 pg (ref 26.0–34.0)
MCHC: 33.6 g/dL (ref 30.0–36.0)
MCV: 81 fL (ref 78.0–100.0)
PLATELETS: 258 10*3/uL (ref 150–400)
RBC: 5.58 MIL/uL — AB (ref 3.87–5.11)
RDW: 14.9 % (ref 11.5–15.5)
WBC: 16.2 10*3/uL — AB (ref 4.0–10.5)

## 2017-05-31 LAB — I-STAT BETA HCG BLOOD, ED (MC, WL, AP ONLY)

## 2017-05-31 LAB — CBG MONITORING, ED: Glucose-Capillary: 98 mg/dL (ref 65–99)

## 2017-05-31 MED ORDER — SODIUM CHLORIDE 0.9 % IV BOLUS (SEPSIS)
1000.0000 mL | Freq: Once | INTRAVENOUS | Status: AC
  Administered 2017-05-31: 1000 mL via INTRAVENOUS

## 2017-05-31 NOTE — ED Notes (Signed)
Pt knows we need urine sample, couldn't provide one at the moment .

## 2017-05-31 NOTE — ED Triage Notes (Signed)
Pt c/o some dizziness and states it feels like her legs want to give out; pt states this has been going on for the last few days; pt c/o right side chest pain

## 2017-06-01 LAB — URINALYSIS, ROUTINE W REFLEX MICROSCOPIC
BILIRUBIN URINE: NEGATIVE
Bacteria, UA: NONE SEEN
GLUCOSE, UA: NEGATIVE mg/dL
KETONES UR: NEGATIVE mg/dL
LEUKOCYTES UA: NEGATIVE
NITRITE: NEGATIVE
PH: 5 (ref 5.0–8.0)
Protein, ur: NEGATIVE mg/dL
SPECIFIC GRAVITY, URINE: 1.011 (ref 1.005–1.030)

## 2017-06-01 NOTE — ED Provider Notes (Signed)
CP was rightsided, reproducible with movement/palpation, doubt ACS/PE    Toni Kim, Toni Kemmer, MD 06/01/17 [phone]

## 2017-06-01 NOTE — ED Provider Notes (Signed)
[facility]Y [facility]PARTMENT Provider Note   CSN: [id number] Arrival date & time: 05/31/17  2220     History   Chief Complaint Chief Complaint  Patient presents with  . Near Syncope    HPI Toni Kim is a 41 y.o. female.  The history is provided by the patient and a relative.  Loss of Consciousness   This is a new problem. Episode onset: Just prior to arrival. The problem has been gradually improving. She lost consciousness for a period of 1 to 5 minutes. The problem is associated with standing up. Associated symptoms include chest pain. Pertinent negatives include fever, seizures and vomiting. She has tried nothing for the symptoms. Her past medical history does not include CAD or CVA.  Patient reports for the past several days that whenever she is ambulating, she feels mildly lightheaded, but no syncope Tonight she was drinking alcohol with friends and family, stood up and then felt lightheaded and had a syncopal episode Her son who witnessed the entire event reports that it was 5 minutes or less, no seizures, no apnea, no CPR no rescue breaths were given Patient woke up and was at baseline, she denies tongue lacerations, denies incontinence No recent syncopal events reported prior to tonight  No recent vomiting or diarrhea She reports that now after passing out, she has mild right-sided chest pain There is no head or facial trauma during the syncopal event No new meds are reported Past Medical History:  Diagnosis Date  . Arthritis   . GERD (gastroesophageal reflux disease)   . H/O hiatal hernia   . Headache    migranes  . Hypertension     Patient Active Problem List   Diagnosis Date Noted  . S/P cervical spinal fusion 05/14/2017  . Cervical spinal stenosis 03/27/2017  . Cervical stenosis of spine 02/17/2017  . Other spondylosis with radiculopathy, cervical region 01/08/2017  . Essential hypertension 05/08/2015  . Neck pain, chronic 09/18/2014  . Back pain  12/01/2013    Past Surgical History:  Procedure Laterality Date  . ANTERIOR CERVICAL DECOMP/DISCECTOMY FUSION N/A 03/27/2017   Procedure: C4-5, C5-6 ANTERIOR CERVICAL DISCECTOMY AND FUSION, ALLOGRAFT, PLATE;  Surgeon: Eldred MangesYates, Mark C, MD;  Location: [facility] OR;  Service: Orthopedics;  Laterality: N/A;  . BACK SURGERY    . CESAREAN SECTION    . LUMBAR LAMINECTOMY/DECOMPRESSION MICRODISCECTOMY Right 12/21/2012   Procedure: LUMBAR LAMINECTOMY/DECOMPRESSION MICRODISCECTOMY 2 level;  Surgeon: Karn CassisErnesto M Botero, MD;  Location: [facility];  Service: Neurosurgery;  Laterality: Right;  Right Lumbar three-four,Lumbar four-five Microdiskectomy  . SPINE SURGERY    . TUBAL LIGATION    . WISDOM TOOTH EXTRACTION      OB History    No data available       Home Medications    Prior to Admission medications   Medication Sig Start Date End Date Taking? Authorizing Provider  hydrochlorothiazide (MICROZIDE) 12.5 MG capsule TAKE 1 CAPSULE EVERY DAY Patient taking differently: Take 12.5 mg by mouth every day 12/29/16  Yes Hawks, Christy A, FNP  IRON PO Take 1 tablet by mouth once a week.   Yes [provider]  albuterol (PROVENTIL HFA;VENTOLIN HFA) 108 (90 Base) MCG/ACT inhaler Inhale 2 puffs into the lungs every 6 (six) hours as needed for wheezing or shortness of breath. 06/12/16   Johna SheriffVincent, Carol L, MD  methocarbamol (ROBAXIN) 500 MG tablet TAKE 1 TABLET BY MOUTH EVERY 6 HOURS AS NEEDED FOR MUSCLE SPASMS Patient not taking: Reported on 05/14/2017 05/12/17  Kerrin Champagne, MD  oxyCODONE-acetaminophen (PERCOCET) 7.5-325 MG tablet Take 1 tablet by mouth every 4 (four) hours as needed for severe pain. Patient not taking: Reported on 05/14/2017 03/27/17   Naida Sleight, PA-C    Family History Family History  Problem Relation Age of Onset  . Heart disease Mother        MI    Social History Social History   Tobacco Use  . Smoking status: Current Every Day Smoker    Packs/day: 0.50    Years:  10.00    Pack years: 5.00    Types: Cigarettes  . Smokeless tobacco: Never Used  Substance Use Topics  . Alcohol use: No  . Drug use: Yes    Types: Marijuana    Comment: last 03/23/2017-uses for pain     Allergies   Patient has no known allergies.   Review of Systems Review of Systems  Constitutional: Negative for fever.  Respiratory: Negative for cough.   Cardiovascular: Positive for chest pain and syncope.  Gastrointestinal: Negative for diarrhea and vomiting.  Neurological: Positive for syncope. Negative for seizures.  All other systems reviewed and are negative.    Physical Exam Updated Vital Signs BP 113/77 (BP Location: Right Arm)   Pulse (!) 101   Resp 18   Ht 1.651 m (5\' 5" )   Wt 104.3 kg (230 lb)   LMP 05/31/2017   SpO2 96%   BMI 38.27 kg/m   Physical Exam  CONSTITUTIONAL: Well developed/well nourished HEAD: Normocephalic/atraumatic EYES: EOMI/PERRL ENMT: Mucous membranes moist, no facial trauma NECK: supple no meningeal signs SPINE/BACK:entire spine nontender CV: S1/S2 noted, no murmurs/rubs/gallops noted LUNGS: Lungs are clear to auscultation bilaterally, no apparent distress ABDOMEN: soft, nontender, no rebound or guarding, bowel sounds noted throughout abdomen GU:no cva tenderness NEURO: Pt is awake/alert/appropriate, moves all extremitiesx4.  No facial droop.  No arm or leg drift EXTREMITIES: pulses normal/equal, full ROM, no calf tenderness or edema SKIN: warm, color normal PSYCH: no abnormalities of mood noted, alert and oriented to situation  ED Treatments / Results  Labs (all labs ordered are listed, but only abnormal results are displayed) Labs Reviewed  BASIC METABOLIC PANEL - Abnormal; Notable for the following components:      Result Value   Glucose, Bld 110 (*)    Creatinine, Ser 1.07 (*)    All other components within normal limits  CBC - Abnormal; Notable for the following components:   WBC 16.2 (*)    RBC 5.58 (*)     Hemoglobin 15.2 (*)    All other components within normal limits  URINALYSIS, ROUTINE W REFLEX MICROSCOPIC - Abnormal; Notable for the following components:   APPearance HAZY (*)    Hgb urine dipstick LARGE (*)    Squamous Epithelial / LPF 0-5 (*)    All other components within normal limits  CBG MONITORING, ED  I-STAT BETA HCG BLOOD, ED ([facility], [facility], [facility] ONLY)    EKG  EKG Interpretation  Date/Time:  Sunday May 31 2017 23:01:46 EST Ventricular Rate:  103 PR Interval:    QRS Duration: 79 QT Interval:  350 QTC Calculation: 459 R Axis:   62 Text Interpretation:  Sinus tachycardia Interpretation limited secondary to artifact Confirmed by Zadie Rhine ([id number]) on 05/31/2017 11:09:54 PM       Radiology Dg Chest 2 View  Result Date: 06/01/2017 CLINICAL DATA:  Right-sided chest pain. EXAM: CHEST  2 VIEW COMPARISON:  None. FINDINGS: The cardiomediastinal contours are normal. The lungs  are clear. Pulmonary vasculature is normal. No consolidation, pleural effusion, or pneumothorax. No acute osseous abnormalities are seen. Surgical hardware in the cervical spine is partially included. IMPRESSION: No acute pulmonary process. Electronically Signed   By: Rubye OaksMelanie  Ehinger M.D.   On: 06/01/2017 00:09    Procedures Procedures   Medications Ordered in ED Medications  sodium chloride 0.9 % bolus 1,000 mL (0 mLs Intravenous Stopped 06/01/17 0055)     Initial Impression / Assessment and Plan / ED Course  I have reviewed the triage vital signs and the nursing notes.  Pertinent labs & imaging results that were available during my care of the patient were reviewed by me and considered in my medical decision making (see chart for details).     12:55 AM BP 115/89 (BP Location: Right Arm)   Pulse 98   Temp 97.9 F (36.6 C) (Oral)   Resp 20   Ht 1.651 m (5\' 5" )   Wt 104.3 kg (230 lb)   LMP 05/31/2017   SpO2 100%   BMI 38.27 kg/m  Patient is improved, she reports feeling improved  upon standing, denies dizziness upon standing Labs Reassuring, EKG unremarkable, chest x-ray reviewed and negative Suspicion for acute cardiac dysrhythmia or other acute emergent issue is low Suspect that this is related to alcohol use use early in the night  Will DC home, discussed strict return precaution  Final Clinical Impressions(s) / ED Diagnoses   Final diagnoses:  Syncope and collapse    ED Discharge Orders    None       Zadie RhineWickline, Kourtlynn Trevor, MD 06/01/17 [phone]

## 2017-06-03 ENCOUNTER — Encounter: Payer: Self-pay | Admitting: Family

## 2017-06-03 ENCOUNTER — Ambulatory Visit: Payer: Medicaid Other | Admitting: Family

## 2017-06-03 VITALS — BP 137/92 | HR 99 | Temp 97.4°F | Ht 65.0 in | Wt 247.4 lb

## 2017-06-03 DIAGNOSIS — J301 Allergic rhinitis due to pollen: Secondary | ICD-10-CM

## 2017-06-03 DIAGNOSIS — R42 Dizziness and giddiness: Secondary | ICD-10-CM

## 2017-06-03 DIAGNOSIS — R55 Syncope and collapse: Secondary | ICD-10-CM

## 2017-06-03 DIAGNOSIS — Z09 Encounter for follow-up examination after completed treatment for conditions other than malignant neoplasm: Secondary | ICD-10-CM | POA: Diagnosis not present

## 2017-06-03 MED ORDER — MECLIZINE HCL 25 MG PO TABS: 25.0000 mg | ORAL_TABLET | Freq: Three times a day (TID) | ORAL | 0 refills | Status: DC | PRN

## 2017-06-03 MED ORDER — NYSTATIN 100000 UNIT/ML MT SUSP
5.0000 mL | Freq: Four times a day (QID) | OROMUCOSAL | 0 refills | Status: DC
Start: 2017-06-03 — End: 2018-05-10

## 2017-06-03 MED ORDER — FLUTICASONE PROPIONATE 50 MCG/ACT NA SUSP: 2.0000 | Freq: Every day | NASAL | 6 refills | Status: DC

## 2017-06-03 NOTE — Progress Notes (Signed)
   Subjective:    Patient ID: Toni Kim, female    DOB: 14-Mar-1976, 41 y.o.   MRN: [id number]  HPI Pt presents to the office today for hospital follow up. Pt went to the ED on 05/31/17 after a syncope episode. Pt had a negative EKG, chest xray and stable lab work.    Pt had did two shots of alcohol and thought maybe this was related. However, pt states she felt dizzy the week prior.   States the dizziness has improved, but continues to have intermittent dizziness.   [facility]*Hospital follow up  Review of Systems  Neurological: Positive for dizziness.  All other systems reviewed and are negative.      Objective:   Physical Exam  Constitutional: She is oriented to person, place, and time. She appears well-developed and well-nourished. No distress.  HENT:  Head: Normocephalic and atraumatic.  Right Ear: External ear normal. Tympanic membrane is erythematous.  Left Ear: Tympanic membrane is erythematous.  Nose: Mucosal edema and rhinorrhea present. Right sinus exhibits maxillary sinus tenderness. Left sinus exhibits maxillary sinus tenderness.  Mouth/Throat: Posterior oropharyngeal erythema present.  Eyes: Pupils are equal, round, and reactive to light.  Neck: Normal range of motion. Neck supple. No thyromegaly present.  Cardiovascular: Normal rate, regular rhythm, normal heart sounds and intact distal pulses.  No murmur heard. Pulmonary/Chest: Effort normal and breath sounds normal. No respiratory distress. She has no wheezes.  Abdominal: Soft. Bowel sounds are normal. She exhibits no distension. There is no tenderness.  Musculoskeletal: Normal range of motion. She exhibits no edema or tenderness.  Neurological: She is alert and oriented to person, place, and time. She has normal reflexes. No cranial nerve deficit.  Skin: Skin is warm and dry.  Psychiatric: She has a normal mood and affect. Her behavior is normal. Judgment and thought content normal.  Vitals reviewed.    BP (!)  137/92   Pulse 99   Temp (!) 97.4 F (36.3 C) (Oral)   Ht 5\' 5"  (1.651 m)   Wt 247 lb 6.4 oz (112.2 kg)   LMP 05/31/2017   BMI 41.17 kg/m      Assessment & Plan:  1. Syncope, unspecified syncope type  2. Hospital discharge follow-up  3. Allergic rhinitis due to pollen, unspecified seasonality - fluticasone (FLONASE) 50 MCG/ACT nasal spray; Place 2 sprays into both nostrils daily.  Dispense: 16 g; Refill: 6  4. Dizziness - meclizine (ANTIVERT) 25 MG tablet; Take 1 tablet (25 mg total) by mouth 3 (three) times daily as needed for dizziness.  Dispense: 30 tablet; Refill: 0   Falls precautions discussed Antivert as needed Labs reviewed with patient that were drawn from hospital Start Flonase and daily zyrtec  Jannifer Rodneyhristy Monserratt Knezevic, FNP

## 2017-06-03 NOTE — Patient Instructions (Signed)
Syncope Syncope is when you temporarily lose consciousness. Syncope may also be called fainting or passing out. It is caused by a sudden decrease in blood flow to the brain. Even though most causes of syncope are not dangerous, syncope can be a sign of a serious medical problem. Signs that you may be about to faint include:  Feeling dizzy or light-headed.  Feeling nauseous.  Seeing all white or all black in your field of vision.  Having cold, clammy skin.  If you fainted, get medical help right away.Call your local emergency services (911 in the [place] Do not drive yourself to the hospital. Follow these instructions at home: Pay attention to any changes in your symptoms. Take these actions to help with your condition:  Have someone stay with you until you feel stable.  Do not drive, use machinery, or play sports until your health care provider says it is okay.  Keep all follow-up visits as told by your health care provider. This is important.  If you start to feel like you might faint, lie down right away and raise (elevate) your feet above the level of your heart. Breathe deeply and steadily. Wait until all of the symptoms have passed.  Drink enough fluid to keep your urine clear or pale yellow.  If you are taking blood pressure or heart medicine, get up slowly and take several minutes to sit and then stand. This can reduce dizziness.  Take over-the-counter and prescription medicines only as told by your health care provider.  Get help right away if:  You have a severe headache.  You have unusual pain in your chest, abdomen, or back.  You are bleeding from your mouth or rectum, or you have black or tarry stool.  You have a very fast or irregular heartbeat (palpitations).  You have pain with breathing.  You faint once or repeatedly.  You have a seizure.  You are confused.  You have trouble walking.  You have severe weakness.  You have vision problems. These  symptoms may represent a serious problem that is an emergency. Do not wait to see if your symptoms will go away. Get medical help right away. Call your local emergency services (911 in the [place] Do not drive yourself to the hospital. This information is not intended to replace advice given to you by your health care provider. Make sure you discuss any questions you have with your health care provider. Document Released: 06/02/2005 Document Revised: 11/08/2015 Document Reviewed: 02/14/2015 Elsevier Interactive Patient Education  2018 Elsevier Inc.  

## 2017-06-03 NOTE — Addendum Note (Signed)
Addended by: Jannifer RodneyHAWKS, Paige Vanderwoude A on: 06/03/2017 04:21 PM   Modules accepted: Orders

## 2017-06-23 ENCOUNTER — Other Ambulatory Visit: Payer: Self-pay | Admitting: Family

## 2017-06-23 DIAGNOSIS — L732 Hidradenitis suppurativa: Secondary | ICD-10-CM

## 2017-06-25 ENCOUNTER — Encounter (INDEPENDENT_AMBULATORY_CARE_PROVIDER_SITE_OTHER): Payer: Self-pay | Admitting: Orthopaedic Surgery

## 2017-06-25 ENCOUNTER — Ambulatory Visit (INDEPENDENT_AMBULATORY_CARE_PROVIDER_SITE_OTHER): Payer: Medicaid Other | Admitting: Orthopaedic Surgery

## 2017-06-25 ENCOUNTER — Ambulatory Visit (INDEPENDENT_AMBULATORY_CARE_PROVIDER_SITE_OTHER): Payer: Medicaid Other

## 2017-06-25 VITALS — BP 126/83 | HR 112

## 2017-06-25 DIAGNOSIS — Z981 Arthrodesis status: Secondary | ICD-10-CM | POA: Diagnosis not present

## 2017-06-25 NOTE — Progress Notes (Signed)
   Post-Op Visit Note   Patient: Toni Kim           Date of Birth: 06/16/1976           MRN: [id number] Visit Date: 06/25/2017 PCP: Junie SpencerHawks, Christy A, FNP   Assessment & Plan: Patient returns post 2 level cervical fusion C4-5 C5-6.  She has some discomfort in her posterior neck.  Repeat x-rays are obtained today.  She still has some numbness along the left side just underneath the mandible but has normal sensation on the opposite right side.  Chief Complaint:  Chief Complaint  Patient presents with  . Neck - Routine Post Op, Follow-up   Visit Diagnoses:  1. Status post cervical spinal fusion     Plan: She can discontinue the collar we will check her back again in 6 months if she is having problems.  She has any progression of problems with facial numbness she can see an ENT surgeon duration.  She has normal facial grimace and smile.  Follow-Up Instructions: No Follow-up on file.   Orders:  Orders Placed This Encounter  Procedures  . XR Cervical Spine 2 or 3 views   No orders of the defined types were placed in this encounter.   Imaging: No results found.  PMFS History: Patient Active Problem List   Diagnosis Date Noted  . S/P cervical spinal fusion 05/14/2017  . Cervical spinal stenosis 03/27/2017  . Cervical stenosis of spine 02/17/2017  . Other spondylosis with radiculopathy, cervical region 01/08/2017  . Essential hypertension 05/08/2015  . Neck pain, chronic 09/18/2014  . Back pain 12/01/2013   Past Medical History:  Diagnosis Date  . Arthritis   . GERD (gastroesophageal reflux disease)   . H/O hiatal hernia   . Headache    migranes  . Hypertension     Family History  Problem Relation Age of Onset  . Heart disease Mother        MI    Past Surgical History:  Procedure Laterality Date  . ANTERIOR CERVICAL DECOMP/DISCECTOMY FUSION N/A 03/27/2017   Procedure: C4-5, C5-6 ANTERIOR CERVICAL DISCECTOMY AND FUSION, ALLOGRAFT, PLATE;  Surgeon: Eldred MangesYates, Emrys Mceachron C,  MD;  Location: [facility] OR;  Service: Orthopedics;  Laterality: N/A;  . BACK SURGERY    . CESAREAN SECTION    . LUMBAR LAMINECTOMY/DECOMPRESSION MICRODISCECTOMY Right 12/21/2012   Procedure: LUMBAR LAMINECTOMY/DECOMPRESSION MICRODISCECTOMY 2 level;  Surgeon: Karn CassisErnesto M Botero, MD;  Location: [facility] OR[facility];  Service: Neurosurgery;  Laterality: Right;  Right Lumbar three-four,Lumbar four-five Microdiskectomy  . SPINE SURGERY    . TUBAL LIGATION    . WISDOM TOOTH EXTRACTION     Social History   Occupational History  . Not on file  Tobacco Use  . Smoking status: Current Every Day Smoker    Packs/day: 0.50    Years: 10.00    Pack years: 5.00    Types: Cigarettes  . Smokeless tobacco: Never Used  Substance and Sexual Activity  . Alcohol use: No  . Drug use: Yes    Types: Marijuana    Comment: last 03/23/2017-uses for pain  . Sexual activity: Not on file

## 2017-07-06 ENCOUNTER — Encounter: Payer: Self-pay | Admitting: Family

## 2017-07-06 ENCOUNTER — Ambulatory Visit: Payer: Medicaid Other | Admitting: Family

## 2017-07-06 VITALS — BP 134/83 | HR 104 | Temp 97.4°F | Ht 65.0 in | Wt 250.0 lb

## 2017-07-06 DIAGNOSIS — J320 Chronic maxillary sinusitis: Secondary | ICD-10-CM | POA: Diagnosis not present

## 2017-07-06 MED ORDER — AMOXICILLIN-POT CLAVULANATE 875-125 MG PO TABS: 1.0000 | ORAL_TABLET | Freq: Two times a day (BID) | ORAL | 0 refills | Status: DC

## 2017-07-06 MED ORDER — FLUCONAZOLE 150 MG PO TABS: 150.0000 mg | ORAL_TABLET | ORAL | 0 refills | Status: DC | PRN

## 2017-07-06 NOTE — Progress Notes (Signed)
   Subjective:    Patient ID: Toni Kim, female    DOB: Mar 10, 1976, 42 y.o.   MRN: [id number]  Sinus Problem  The current episode started in the past 7 days. The problem has been rapidly worsening since onset. There has been no fever. Her pain is at a severity of 10/10. The pain is moderate. Associated symptoms include congestion, coughing, headaches, a hoarse voice, sinus pressure and sneezing. Past treatments include oral decongestants. The treatment provided mild relief.      Review of Systems  HENT: Positive for congestion, hoarse voice, sinus pressure and sneezing.   Respiratory: Positive for cough.   Neurological: Positive for headaches.  All other systems reviewed and are negative.      Objective:   Physical Exam  Constitutional: She is oriented to person, place, and time. She appears well-developed and well-nourished. No distress.  HENT:  Head: Normocephalic and atraumatic.  Right Ear: External ear normal. Tympanic membrane is bulging.  Left Ear: External ear normal. Tympanic membrane is bulging.  Nose: Mucosal edema and rhinorrhea present. Right sinus exhibits maxillary sinus tenderness and frontal sinus tenderness. Left sinus exhibits maxillary sinus tenderness and frontal sinus tenderness.  Mouth/Throat: Posterior oropharyngeal erythema present.  Eyes: Pupils are equal, round, and reactive to light.  Neck: Normal range of motion. Neck supple. No thyromegaly present.  Cardiovascular: Normal rate, regular rhythm, normal heart sounds and intact distal pulses.  No murmur heard. Pulmonary/Chest: Effort normal and breath sounds normal. No respiratory distress. She has no wheezes.  Abdominal: Soft. Bowel sounds are normal. She exhibits no distension. There is no tenderness.  Musculoskeletal: Normal range of motion. She exhibits no edema or tenderness.  Neurological: She is alert and oriented to person, place, and time. She has normal reflexes. No cranial nerve deficit.  Skin:  Skin is warm and dry.  Psychiatric: She has a normal mood and affect. Her behavior is normal. Judgment and thought content normal.  Vitals reviewed.     BP 134/83   Pulse (!) 104   Temp (!) 97.4 F (36.3 C) (Oral)   Ht 5\' 5"  (1.651 m)   Wt 250 lb (113.4 kg)   BMI 41.60 kg/m      Assessment & Plan:  1. Chronic maxillary sinusitis - Take meds as prescribed - Use a cool mist humidifier  -Use saline nose sprays frequently -Force fluids -For any cough or congestion  Use plain Mucinex- regular strength or max strength is fine -For fever or aces or pains- take tylenol or ibuprofen appropriate for age and weight. -Throat lozenges if help -New toothbrush in 3 days - Ambulatory referral to ENT - amoxicillin-clavulanate (AUGMENTIN) 875-125 MG tablet; Take 1 tablet by mouth 2 (two) times daily.  Dispense: 14 tablet; Refill: 0     Jannifer Rodneyhristy Dayten Juba, FNP

## 2017-07-06 NOTE — Patient Instructions (Signed)

## 2017-07-22 ENCOUNTER — Ambulatory Visit (INDEPENDENT_AMBULATORY_CARE_PROVIDER_SITE_OTHER): Payer: Medicaid Other | Admitting: *Deleted

## 2017-07-22 DIAGNOSIS — Z111 Encounter for screening for respiratory tuberculosis: Secondary | ICD-10-CM | POA: Diagnosis not present

## 2017-07-22 DIAGNOSIS — Z23 Encounter for immunization: Secondary | ICD-10-CM

## 2017-07-22 NOTE — Progress Notes (Signed)
PPD placed L forearm Pt tolerated well 

## 2017-07-24 LAB — TB SKIN TEST
INDURATION: 0 mm
TB Skin Test: NEGATIVE

## 2017-07-29 DIAGNOSIS — R51 Headache: Secondary | ICD-10-CM | POA: Diagnosis not present

## 2017-07-29 DIAGNOSIS — T7840XA Allergy, unspecified, initial encounter: Secondary | ICD-10-CM | POA: Diagnosis not present

## 2017-09-10 ENCOUNTER — Telehealth: Payer: Self-pay | Admitting: Family

## 2017-09-11 ENCOUNTER — Other Ambulatory Visit: Payer: Self-pay | Admitting: Family

## 2017-09-11 DIAGNOSIS — W57XXXA Bitten or stung by nonvenomous insect and other nonvenomous arthropods, initial encounter: Secondary | ICD-10-CM

## 2017-09-11 NOTE — Telephone Encounter (Signed)
FAXED

## 2017-09-17 ENCOUNTER — Encounter: Payer: Self-pay | Admitting: Family

## 2017-09-17 DIAGNOSIS — Z1231 Encounter for screening mammogram for malignant neoplasm of breast: Secondary | ICD-10-CM | POA: Diagnosis not present

## 2018-05-10 ENCOUNTER — Encounter: Payer: Self-pay | Admitting: Family Medicine

## 2018-05-10 ENCOUNTER — Ambulatory Visit: Payer: Medicaid Other | Admitting: Family Medicine

## 2018-05-10 VITALS — BP 135/83 | HR 85 | Temp 97.5°F | Ht 65.0 in | Wt 234.6 lb

## 2018-05-10 DIAGNOSIS — J329 Chronic sinusitis, unspecified: Secondary | ICD-10-CM | POA: Diagnosis not present

## 2018-05-10 DIAGNOSIS — J40 Bronchitis, not specified as acute or chronic: Secondary | ICD-10-CM

## 2018-05-10 DIAGNOSIS — I10 Essential (primary) hypertension: Secondary | ICD-10-CM

## 2018-05-10 LAB — VERITOR FLU A/B WAIVED
INFLUENZA B: NEGATIVE
Influenza A: NEGATIVE

## 2018-05-10 MED ORDER — LEVOFLOXACIN 500 MG PO TABS: 500.0000 mg | ORAL_TABLET | Freq: Every day | ORAL | 0 refills | Status: DC

## 2018-05-10 MED ORDER — FLUCONAZOLE 150 MG PO TABS: 150.0000 mg | ORAL_TABLET | Freq: Once | ORAL | 0 refills | Status: AC

## 2018-05-10 MED ORDER — BETAMETHASONE SOD PHOS & ACET 6 (3-3) MG/ML IJ SUSP
6.0000 mg | Freq: Once | INTRAMUSCULAR | Status: AC
  Administered 2018-05-10: 6 mg via INTRAMUSCULAR

## 2018-05-10 MED ORDER — HYDROCHLOROTHIAZIDE 12.5 MG PO CAPS: 12.5000 mg | ORAL_CAPSULE | Freq: Every day | ORAL | 1 refills | Status: DC

## 2018-05-10 NOTE — Addendum Note (Signed)
Addended by: Mechele ClaudeSTACKS, Valeen Borys on: 05/10/2018 02:31 PM   Modules accepted: Orders

## 2018-05-10 NOTE — Progress Notes (Addendum)
Chief Complaint  Patient presents with  . Influenza  . Cough    HPI  Patient presents today for Symptoms include congestion, facial pain, nasal congestion, green productive cough, post nasal drip and sinus pressure. There is no fever, chills, or sweats. Onset of symptoms was a few days ago, gradually worsening since that time.  No relief with Mucinex.    PMH: Smoking status noted ROS: Per HPI  Objective: BP 135/83   Pulse 85   Temp (!) 97.5 F (36.4 C)   Ht 5\' 5"  (1.651 m)   Wt 234 lb 9.6 oz (106.4 kg)   BMI 39.04 kg/m  Gen: NAD, alert, cooperative with exam HEENT: NCAT, EOMI, PERRL. Nasal passages inflamed, erythematous. Pharynx has drainage posteriorly. CV: RRR, good S1/S2, no murmur Resp: scattered rhonchi Ext: No edema, warm Neuro: Alert and oriented, No gross deficits  Assessment and plan:  1. Sinobronchitis   2. Essential hypertension     Meds ordered this encounter  Medications  . hydrochlorothiazide (MICROZIDE) 12.5 MG capsule    Sig: Take 1 capsule (12.5 mg total) by mouth daily.    Dispense:  90 capsule    Refill:  1    Must be seen before next refill  . betamethasone acetate-betamethasone sodium phosphate (CELESTONE) injection 6 mg  . levofloxacin (LEVAQUIN) 500 MG tablet    Sig: Take 1 tablet (500 mg total) by mouth daily. For 10 days    Dispense:  10 tablet    Refill:  0  . fluconazole (DIFLUCAN) 150 MG tablet    Sig: Take 1 tablet (150 mg total) by mouth once for 1 dose. At onset of symptoms. Repeat at end of treatment    Dispense:  2 tablet    Refill:  0    No orders of the defined types were placed in this encounter.   Follow up as needed.  Mechele ClaudeWarren Terri Malerba, MD

## 2018-05-10 NOTE — Addendum Note (Signed)
Addended by: Tamera PuntWRAY, WENDY S on: 05/10/2018 04:20 PM   Modules accepted: Orders

## 2018-07-21 ENCOUNTER — Ambulatory Visit (INDEPENDENT_AMBULATORY_CARE_PROVIDER_SITE_OTHER): Payer: Medicaid Other | Admitting: *Deleted

## 2018-07-21 DIAGNOSIS — Z111 Encounter for screening for respiratory tuberculosis: Secondary | ICD-10-CM | POA: Diagnosis not present

## 2018-07-21 DIAGNOSIS — Z23 Encounter for immunization: Secondary | ICD-10-CM

## 2018-07-21 NOTE — Progress Notes (Signed)
PPD placed L arm Tolerated well

## 2018-07-23 LAB — TB SKIN TEST
Induration: 0 mm
TB Skin Test: NEGATIVE

## 2018-08-30 ENCOUNTER — Encounter: Payer: Self-pay | Admitting: Family

## 2018-08-30 ENCOUNTER — Ambulatory Visit: Payer: Medicaid Other | Admitting: Family

## 2018-08-30 ENCOUNTER — Other Ambulatory Visit: Payer: Self-pay

## 2018-08-30 VITALS — BP 139/97 | HR 87 | Temp 97.0°F | Ht 65.0 in | Wt 231.0 lb

## 2018-08-30 DIAGNOSIS — H01114 Allergic dermatitis of left upper eyelid: Secondary | ICD-10-CM

## 2018-08-30 MED ORDER — METHYLPREDNISOLONE ACETATE 80 MG/ML IJ SUSP
80.0000 mg | Freq: Once | INTRAMUSCULAR | Status: AC
  Administered 2018-08-30: 80 mg via INTRAMUSCULAR

## 2018-08-30 MED ORDER — MUPIROCIN 2 % EX OINT: 1.0000 "application " | TOPICAL_OINTMENT | Freq: Two times a day (BID) | CUTANEOUS | 0 refills | Status: DC

## 2018-08-30 NOTE — Patient Instructions (Signed)
Contact Dermatitis  Dermatitis is redness, soreness, and swelling (inflammation) of the skin. Contact dermatitis is a reaction to something that touches the skin.  There are two types of contact dermatitis:   Irritant contact dermatitis. This happens when something bothers (irritates) your skin, like soap.   Allergic contact dermatitis. This is caused when you are exposed to something that you are allergic to, such as poison ivy.  What are the causes?   Common causes of irritant contact dermatitis include:  ? Makeup.  ? Soaps.  ? Detergents.  ? Bleaches.  ? Acids.  ? Metals, such as nickel.   Common causes of allergic contact dermatitis include:  ? Plants.  ? Chemicals.  ? Jewelry.  ? Latex.  ? Medicines.  ? Preservatives in products, such as clothing.  What increases the risk?   Having a job that exposes you to things that bother your skin.   Having asthma or eczema.  What are the signs or symptoms?  Symptoms may happen anywhere the irritant has touched your skin. Symptoms include:   Dry or flaky skin.   Redness.   Cracks.   Itching.   Pain or a burning feeling.   Blisters.   Blood or clear fluid draining from skin cracks.  With allergic contact dermatitis, swelling may occur. This may happen in places such as the eyelids, mouth, or genitals.  How is this treated?   This condition is treated by checking for the cause of the reaction and protecting your skin. Treatment may also include:  ? Steroid creams, ointments, or medicines.  ? Antibiotic medicines or other ointments, if you have a skin infection.  ? Lotion or medicines to help with itching.  ? A bandage (dressing).  Follow these instructions at home:  Skin care   Moisturize your skin as needed.   Put cool cloths on your skin.   Put a baking soda paste on your skin. Stir water into baking soda until it looks like a paste.   Do not scratch your skin.   Avoid having things rub up against your skin.   Avoid the use of soaps, perfumes, and  dyes.  Medicines   Take or apply over-the-counter and prescription medicines only as told by your doctor.   If you were prescribed an antibiotic medicine, take or apply it as told by your doctor. Do not stop using it even if your condition starts to get better.  Bathing   Take a bath with:  ? Epsom salts.  ? Baking soda.  ? Colloidal oatmeal.   Bathe less often.   Bathe in warm water. Avoid using hot water.  Bandage care   If you were given a bandage, change it as told by your health care provider.   Wash your hands with soap and water before and after you change your bandage. If soap and water are not available, use hand sanitizer.  General instructions   Avoid the things that caused your reaction. If you do not know what caused it, keep a journal. Write down:  ? What you eat.  ? What skin products you use.  ? What you drink.  ? What you wear in the area that has symptoms. This includes jewelry.   Check the affected areas every day for signs of infection. Check for:  ? More redness, swelling, or pain.  ? More fluid or blood.  ? Warmth.  ? Pus or a bad smell.   Keep all follow-up visits as   told by your doctor. This is important.  Contact a doctor if:   You do not get better with treatment.   Your condition gets worse.   You have signs of infection, such as:  ? More swelling.  ? Tenderness.  ? More redness.  ? Soreness.  ? Warmth.   You have a fever.   You have new symptoms.  Get help right away if:   You have a very bad headache.   You have neck pain.   Your neck is stiff.   You throw up (vomit).   You feel very sleepy.   You see red streaks coming from the area.   Your bone or joint near the area hurts after the skin has healed.   The area turns darker.   You have trouble breathing.  Summary   Dermatitis is redness, soreness, and swelling of the skin.   Symptoms may occur where the irritant has touched you.   Treatment may include medicines and skin care.   If you do not know what caused  your reaction, keep a journal.   Contact a doctor if your condition gets worse or you have signs of infection.  This information is not intended to replace advice given to you by your health care provider. Make sure you discuss any questions you have with your health care provider.  Document Released: 03/30/2009 Document Revised: 12/16/2017 Document Reviewed: 12/16/2017  Elsevier Interactive Patient Education  2019 [employer]vier Inc[employer]

## 2018-08-30 NOTE — Progress Notes (Signed)
   Subjective:    Patient ID: Toni Kim, female    DOB: [birth year], 43 y.o.   MRN: [id number]  Chief Complaint  Patient presents with  . swollen, itching left upper eyelid    HPI PT presents to the office today with left eyelid swelling that started yesterday. She states she was getting her hair fixed and  she noticed two "bumps" on her left forehead that was itching. Then a few hours later she noticed her eyelid was swelling and itching.   Denies any new chemicals or foods. Denies SOB, chest pain, and   Review of Systems  HENT:       Left eyelid swelling  All other systems reviewed and are negative.      Objective:   Physical Exam Vitals signs reviewed.  Constitutional:      General: She is not in acute distress.    Appearance: She is well-developed.  HENT:     Head: Normocephalic and atraumatic.  Eyes:     Pupils: Pupils are equal, round, and reactive to light.  Neck:     Musculoskeletal: Normal range of motion and neck supple.     Thyroid: No thyromegaly.  Cardiovascular:     Rate and Rhythm: Normal rate and regular rhythm.     Heart sounds: Normal heart sounds. No murmur.  Pulmonary:     Effort: Pulmonary effort is normal. No respiratory distress.     Breath sounds: Normal breath sounds. No wheezing.  Abdominal:     General: Bowel sounds are normal. There is no distension.     Palpations: Abdomen is soft.     Tenderness: There is no abdominal tenderness.  Musculoskeletal: Normal range of motion.        General: No tenderness.  Skin:    General: Skin is warm and dry.  Neurological:     Mental Status: She is alert and oriented to person, place, and time.     Cranial Nerves: No cranial nerve deficit.     Deep Tendon Reflexes: Reflexes are normal and symmetric.  Psychiatric:        Behavior: Behavior normal.        Thought Content: Thought content normal.        Judgment: Judgment normal.       BP (!) 139/97   Pulse 87   Temp (!) 97 F (36.1 C) (Oral)    Ht 5\' 5"  (1.651 m)   Wt 231 lb (104.8 kg)   BMI 38.44 kg/m      Assessment & Plan:  [name] comes in today with chief complaint of swollen, itching left upper eyelid   Diagnosis and orders addressed:  1. Allergic dermatitis of left upper eyelid Avoid rubbing eyes Avoid irritants  Cool compresses as needed Good hand hygiene RTO if symptoms worsen or do not improve - methylPREDNISolone acetate (DEPO-MEDROL) injection 80 mg   Jannifer Rodney, FNP

## 2018-08-31 ENCOUNTER — Encounter (HOSPITAL_COMMUNITY): Payer: Self-pay | Admitting: Emergency Medicine

## 2018-08-31 ENCOUNTER — Other Ambulatory Visit: Payer: Self-pay

## 2018-08-31 ENCOUNTER — Emergency Department (HOSPITAL_COMMUNITY)
Admission: EM | Admit: 2018-08-31 | Discharge: 2018-08-31 | Disposition: A | Discharge status: Home / Self Care | Payer: Medicaid Other | Attending: Emergency Medicine | Admitting: Emergency Medicine

## 2018-08-31 DIAGNOSIS — Y929 Unspecified place or not applicable: Secondary | ICD-10-CM | POA: Diagnosis not present

## 2018-08-31 DIAGNOSIS — I10 Essential (primary) hypertension: Secondary | ICD-10-CM | POA: Diagnosis not present

## 2018-08-31 DIAGNOSIS — Y92009 Unspecified place in unspecified non-institutional (private) residence as the place of occurrence of the external cause: Secondary | ICD-10-CM | POA: Diagnosis not present

## 2018-08-31 DIAGNOSIS — Y999 Unspecified external cause status: Secondary | ICD-10-CM | POA: Insufficient documentation

## 2018-08-31 DIAGNOSIS — H02844 Edema of left upper eyelid: Secondary | ICD-10-CM | POA: Diagnosis present

## 2018-08-31 DIAGNOSIS — Z79899 Other long term (current) drug therapy: Secondary | ICD-10-CM | POA: Insufficient documentation

## 2018-08-31 DIAGNOSIS — H02846 Edema of left eye, unspecified eyelid: Secondary | ICD-10-CM

## 2018-08-31 DIAGNOSIS — H5789 Other specified disorders of eye and adnexa: Secondary | ICD-10-CM | POA: Diagnosis not present

## 2018-08-31 DIAGNOSIS — W57XXXA Bitten or stung by nonvenomous insect and other nonvenomous arthropods, initial encounter: Secondary | ICD-10-CM | POA: Diagnosis not present

## 2018-08-31 DIAGNOSIS — S0096XA Insect bite (nonvenomous) of unspecified part of head, initial encounter: Secondary | ICD-10-CM

## 2018-08-31 DIAGNOSIS — S0086XA Insect bite (nonvenomous) of other part of head, initial encounter: Secondary | ICD-10-CM | POA: Diagnosis not present

## 2018-08-31 DIAGNOSIS — F17210 Nicotine dependence, cigarettes, uncomplicated: Secondary | ICD-10-CM | POA: Diagnosis not present

## 2018-08-31 DIAGNOSIS — Y939 Activity, unspecified: Secondary | ICD-10-CM | POA: Diagnosis not present

## 2018-08-31 MED ORDER — FAMOTIDINE 20 MG PO TABS
20.0000 mg | ORAL_TABLET | Freq: Once | ORAL | Status: AC
  Administered 2018-08-31: 20 mg via ORAL
  Filled 2018-08-31: qty 1

## 2018-08-31 MED ORDER — DIPHENHYDRAMINE HCL 25 MG PO CAPS
25.0000 mg | ORAL_CAPSULE | Freq: Once | ORAL | Status: AC
  Administered 2018-08-31: 25 mg via ORAL
  Filled 2018-08-31: qty 1

## 2018-08-31 NOTE — ED Provider Notes (Signed)
[facility] EMERGENCY DEPARTMENT Provider Note   CSN: [id number] Arrival date & time: 08/31/18  0219  Time seen 2:45 AM  History   Chief Complaint Chief Complaint  Patient presents with  . Eye Problem    HPI Toni Kim is a 43 y.o. female.     HPI patient states large 15th she was getting her hair done at her boyfriend's house and she felt something on her left forehead.  When she went into the bathroom and looked she saw 2 dots on her forehead that she thought were bug bites.  She is unaware of any insects in his house.  She states it was itching and she put alcohol on it.  She states later it started draining some clear fluid.  2 to 3 hours later she started having itching of her left upper eyelid and then it started swelling.  By her primary care provider earlier today around 26 and was given a Depo-Provera shot in the office and was started on Bactroban ointment.  She states she has put the ointment on the area once.  She states she laid down after her office appointment and woke up about 5 and the swelling seemed worse.  She states she still has a lot of itching and just mild discomfort.  She only has pain on movement of her eye when she points to the temple area if she looks up.  She denies any fevers.  She is never had anything like this before.  She states about 1 AM she took either 5 or 10 cc of children's liquid Benadryl.  PCP Junie Spencer, FNP   Past Medical History:  Diagnosis Date  . Arthritis   . GERD (gastroesophageal reflux disease)   . H/O hiatal hernia   . Headache    migranes  . Hypertension     Patient Active Problem List   Diagnosis Date Noted  . S/P cervical spinal fusion 05/14/2017  . Cervical spinal stenosis 03/27/2017  . Cervical stenosis of spine 02/17/2017  . Other spondylosis with radiculopathy, cervical region 01/08/2017  . Essential hypertension 05/08/2015  . Neck pain, chronic 09/18/2014  . Back pain 12/01/2013    Past Surgical  History:  Procedure Laterality Date  . ANTERIOR CERVICAL DECOMP/DISCECTOMY FUSION N/A 03/27/2017   Procedure: C4-5, C5-6 ANTERIOR CERVICAL DISCECTOMY AND FUSION, ALLOGRAFT, PLATE;  Surgeon: Eldred Manges, MD;  Location: [facility] OR;  Service: Orthopedics;  Laterality: N/A;  . BACK SURGERY    . CESAREAN SECTION    . LUMBAR LAMINECTOMY/DECOMPRESSION MICRODISCECTOMY Right 12/21/2012   Procedure: LUMBAR LAMINECTOMY/DECOMPRESSION MICRODISCECTOMY 2 level;  Surgeon: Karn Cassis, MD;  Location: [facility]UR[facility] ORS;  Service: Neurosurgery;  Laterality: Right;  Right Lumbar three-four,Lumbar four-five Microdiskectomy  . SPINE SURGERY    . TUBAL LIGATION    . WISDOM TOOTH EXTRACTION       OB History   No obstetric history on file.      Home Medications    Prior to Admission medications   Medication Sig Start Date End Date Taking? Authorizing Provider  hydrochlorothiazide (MICROZIDE) 12.5 MG capsule Take 1 capsule (12.5 mg total) by mouth daily. 05/10/18   Mechele Claude, MD  mupirocin ointment (BACTROBAN) 2 % Apply 1 application topically 2 (two) times daily. 08/30/18   Junie Spencer, FNP    Family History Family History  Problem Relation Age of Onset  . Heart disease Mother        MI    Social History Social  History   Tobacco Use  . Smoking status: Current Every Day Smoker    Packs/day: 0.50    Years: 10.00    Pack years: 5.00    Types: Cigarettes  . Smokeless tobacco: Never Used  Substance Use Topics  . Alcohol use: No  . Drug use: Yes    Types: Marijuana    Comment: last 03/23/2017-uses for pain     Allergies   Patient has no known allergies.   Review of Systems Review of Systems  All other systems reviewed and are negative.    Physical Exam Updated Vital Signs BP (!) 164/108 (BP Location: Right Arm)   Pulse 94   Temp 97.6 F (36.4 C) (Oral)   Resp 17   SpO2 99%   Vital signs normal except for hypertension   Physical Exam Vitals signs and nursing note  reviewed.  Constitutional:      Appearance: Normal appearance.  HENT:     Head: Normocephalic.     Comments: Patient has 2 areas on her left forehead that are not draining at this time.  There is a mild amount of redness and swelling around it.    Right Ear: External ear normal.     Left Ear: External ear normal.     Nose: Nose normal.     Mouth/Throat:     Mouth: Mucous membranes are moist.     Pharynx: No oropharyngeal exudate or posterior oropharyngeal erythema.  Eyes:     Extraocular Movements: Extraocular movements intact.     Conjunctiva/sclera: Conjunctivae normal.     Pupils: Pupils are equal, round, and reactive to light.     Comments: Patient has a lot of swelling of her left upper eyelid and little bit of her left lower eyelid.  The skin looks reddened and darkened.  When I have her do range of motion it is intact and she only complains of some discomfort in her left temple when she looks up or to the left.  She does not have pain around the globe of the eye or the eyelids.  The globe itself appears normal.  Neck:     Musculoskeletal: Normal range of motion.  Cardiovascular:     Rate and Rhythm: Normal rate and regular rhythm.  Pulmonary:     Effort: Pulmonary effort is normal. No respiratory distress.  Musculoskeletal: Normal range of motion.  Skin:    General: Skin is warm and dry.     Findings: Lesion present.  Neurological:     General: No focal deficit present.     Mental Status: She is alert and oriented to person, place, and time.     Cranial Nerves: No cranial nerve deficit.  Psychiatric:        Mood and Affect: Mood normal.        Behavior: Behavior normal.        Thought Content: Thought content normal.        ED Treatments / Results  Labs (all labs ordered are listed, but only abnormal results are displayed) Labs Reviewed - No data to display  EKG None  Radiology No results found.  Procedures Procedures (including critical care time)   Medications Ordered in ED Medications  famotidine (PEPCID) tablet 20 mg (20 mg Oral Given 08/31/18 0301)  diphenhydrAMINE (BENADRYL) capsule 25 mg (25 mg Oral Given 08/31/18 0300)     Initial Impression / Assessment and Plan / ED Course  I have reviewed the triage vital signs and the nursing  notes.  Pertinent labs & imaging results that were available during my care of the patient were reviewed by me and considered in my medical decision making (see chart for details).    Patient was given Pepcid and Benadryl to give her an additional dose to give her a correct adult dose with what she took at home.  She was given ice pack.    I went back and reexamined her she has good range of motion of her eyes and when she looks upward and to the left she states it hurts in her temple but not around her globe or her eyelids.  She has no fever.  The swelling feels soft but does not feel indurated.  I have a low index of suspicion right now for periorbital cellulitis.  She was encouraged to continue her treatment.  She already got a Depo-Medrol shot at the doctor's office.  She can put the Bactroban on the insect bites.  She can take Pepcid, Claritin or Zyrtec, and use ice packs on her eyelids.  We also discussed that the eyelid tissues are very loose and they do swell easily.  She should return if she gets a fever or pain in her eyes when she moves them.  Final Clinical Impressions(s) / ED Diagnoses   Final diagnoses:  Insect bite of other part of head, initial encounter  Swelling of left eyelid    ED Discharge Orders    None    OTC zyrtec or claritin, pepcid, benadryl  Plan discharge  Devoria Albe, MD, Concha Pyo, MD 08/31/18 [phone]

## 2018-08-31 NOTE — ED Triage Notes (Signed)
Pt C/O left eye swelling. Pt states she was bit on her forehead yesterday morning. Pt saw PCP and was given "steroid shot and cream." Pt also reports taking benadryl PTA.

## 2018-08-31 NOTE — Discharge Instructions (Addendum)
Use ice packs over your eyelids for swelling and the itching.  Use the Bactroban ointment on the insect bites on your forehead twice a day.  Take Pepcid over-the-counter twice a day for the next week.  You can take Claritin or Zyrtec over-the-counter once a day for the itching and swelling.  Take Benadryl 25 to 50 mg every 6 hours as needed for itching or swelling not controlled by the Claritin or Zyrtec.  You got the steroid shot in the doctor's office which will last for several days.  Recheck if you get fever, pain in your eye or if it is not improving over the next 24 to 48 hours.

## 2018-09-09 ENCOUNTER — Encounter: Payer: Self-pay | Admitting: Family

## 2018-09-09 ENCOUNTER — Ambulatory Visit (INDEPENDENT_AMBULATORY_CARE_PROVIDER_SITE_OTHER): Payer: Medicaid Other | Admitting: Family

## 2018-09-09 ENCOUNTER — Other Ambulatory Visit: Payer: Self-pay

## 2018-09-09 DIAGNOSIS — H01116 Allergic dermatitis of left eye, unspecified eyelid: Secondary | ICD-10-CM

## 2018-09-09 MED ORDER — PREDNISONE 10 MG (21) PO TBPK: ORAL_TABLET | ORAL | 0 refills | Status: DC

## 2018-09-09 NOTE — Progress Notes (Signed)
   Virtual Visit via telephone Note  I connected with Toni Kim on 09/09/18 at 10:07 AM by telephone and verified that I am speaking with the correct person using two identifiers. Toni Kim is currently located at work and no one is currently with her during visit. The provider, Jannifer Rodney, FNP is located in their office at time of visit.  I discussed the limitations, risks, security and privacy concerns of performing an evaluation and management service by telephone and the availability of in person appointments. I also discussed with the patient that there may be a patient responsible charge related to this service. The patient expressed understanding and agreed to proceed.   History and Present Illness: HPI PT complaining of left swelling and irration. She was seen on  08/30/18 with similar complaint and diagnosed with allergic dermatitis of eye lid and given Depo-Medrol injection and told to take pepcid, zyrtec, and benadryl . She states her symptoms resolved after 24 hours. However, she states she noticed on 09/07/18 her left eye was itching again and she started rubbing it. She reports her eye lid is swollen and erythemas now.   She states she washes her face daily with dial soap. Denies any new chemicals, lotions, hand soaps, hand sanitizers, or washes.     Review of Systems  Skin: Positive for rash.      Observations/Objective: No SOB or distress noted.  Assessment and Plan: 1. Allergic dermatitis eyelid, left Avoid rubbing or scratching eye DO NOT touch eyes!!! Avoid any scented lotion, hand soap, face wash, or body wash Restart Pepcid, zyrtec, and benadryl  Will send in prednisone RTO if symptoms worsen or do not improve   - predniSONE (STERAPRED UNI-PAK 21 TAB) 10 MG (21) TBPK tablet; Use as directed  Dispense: 21 tablet; Refill: 0      I discussed the assessment and treatment plan with the patient. The patient was provided an opportunity to ask questions and  all were answered. The patient agreed with the plan and demonstrated an understanding of the instructions.   The patient was advised to call back or seek an in-person evaluation if the symptoms worsen or if the condition fails to improve as anticipated.  The above assessment and management plan was discussed with the patient. The patient verbalized understanding of and has agreed to the management plan. Patient is aware to call the clinic if symptoms persist or worsen. Patient is aware when to return to the clinic for a follow-up visit. Patient educated on when it is appropriate to go to the emergency department.    Called ended at 10:14 AM I provided 7  minutes of non-face-to-face time during this encounter.    Jannifer Rodney, FNP

## 2018-11-01 ENCOUNTER — Other Ambulatory Visit: Payer: Self-pay

## 2018-11-02 ENCOUNTER — Ambulatory Visit: Payer: Medicaid Other | Admitting: Family

## 2018-11-25 ENCOUNTER — Ambulatory Visit: Payer: Medicaid Other | Admitting: Family

## 2018-11-25 ENCOUNTER — Other Ambulatory Visit: Payer: Self-pay

## 2019-03-29 DIAGNOSIS — Z0000 Encounter for general adult medical examination without abnormal findings: Secondary | ICD-10-CM | POA: Diagnosis not present

## 2019-05-05 DIAGNOSIS — N939 Abnormal uterine and vaginal bleeding, unspecified: Secondary | ICD-10-CM | POA: Diagnosis not present

## 2019-05-10 ENCOUNTER — Other Ambulatory Visit: Payer: Self-pay | Admitting: Family

## 2019-05-10 DIAGNOSIS — N939 Abnormal uterine and vaginal bleeding, unspecified: Secondary | ICD-10-CM | POA: Diagnosis not present

## 2019-05-19 DIAGNOSIS — N83291 Other ovarian cyst, right side: Secondary | ICD-10-CM | POA: Diagnosis not present

## 2019-05-19 DIAGNOSIS — N939 Abnormal uterine and vaginal bleeding, unspecified: Secondary | ICD-10-CM | POA: Diagnosis not present

## 2019-08-03 ENCOUNTER — Ambulatory Visit (INDEPENDENT_AMBULATORY_CARE_PROVIDER_SITE_OTHER): Payer: Medicaid Other | Admitting: Family Medicine

## 2019-08-03 ENCOUNTER — Encounter: Payer: Self-pay | Admitting: Family Medicine

## 2019-08-03 DIAGNOSIS — K0889 Other specified disorders of teeth and supporting structures: Secondary | ICD-10-CM | POA: Diagnosis not present

## 2019-08-03 MED ORDER — AMOXICILLIN 875 MG PO TABS: 875.0000 mg | ORAL_TABLET | Freq: Two times a day (BID) | ORAL | 0 refills | Status: AC

## 2019-08-03 NOTE — Progress Notes (Signed)
    Subjective:    Patient ID: Toni Kim, female    DOB: 1976/03/01, 44 y.o.   MRN: [id number]   HPI: Toni Kim is a 44 y.o. female presenting for jawbone hurting all the way to her temple. Right side. Tooth ache is the maxillary 2nd molar. Has had wisdom teeth removed. Onset was two or three weeks. Was mild and intermittent. Now more severe and constant.    Depression screen [facility] 2/9 08/30/2018 05/10/2018 07/06/2017 06/03/2017 02/17/2017  Decreased Interest 0 0 0 0 0  Down, Depressed, Hopeless 0 0 0 0 0  PHQ - 2 Score 0 0 0 0 0     Relevant past medical, surgical, family and social history reviewed and updated as indicated.  Interim medical history since our last visit reviewed. Allergies and medications reviewed and updated.  ROS:  Review of Systems  Constitutional: Negative for appetite change, chills, diaphoresis and fever.  HENT: Positive for postnasal drip. Negative for congestion, ear pain, hearing loss, rhinorrhea, sore throat and trouble swallowing (sometimes).   Respiratory: Positive for cough (at night). Negative for chest tightness and shortness of breath.   Cardiovascular: Negative for chest pain and palpitations.  Gastrointestinal: Negative for abdominal pain.  Musculoskeletal: Negative for arthralgias.  Skin: Negative for rash.     Social History   Tobacco Use  Smoking Status Current Every Day Smoker  . Packs/day: 0.50  . Years: 10.00  . Pack years: 5.00  . Types: Cigarettes  Smokeless Tobacco Never Used       Objective:     Wt Readings from Last 3 Encounters:  08/30/18 231 lb (104.8 kg)  05/10/18 234 lb 9.6 oz (106.4 kg)  07/06/17 250 lb (113.4 kg)     Exam deferred. Pt. Harboring due to COVID 19. Phone visit performed.   Assessment & Plan:   1. Toothache     Meds ordered this encounter  Medications  . amoxicillin (AMOXIL) 875 MG tablet    Sig: Take 1 tablet (875 mg total) by mouth 2 (two) times daily for 10 days.    Dispense:  20 tablet     Refill:  0    No orders of the defined types were placed in this encounter.     Diagnoses and all orders for this visit:  Toothache  Other orders -     amoxicillin (AMOXIL) 875 MG tablet; Take 1 tablet (875 mg total) by mouth 2 (two) times daily for 10 days.    Virtual Visit via telephone Note  I discussed the limitations, risks, security and privacy concerns of performing an evaluation and management service by telephone and the availability of in person appointments. The patient was identified with two identifiers. Pt.expressed understanding and agreed to proceed. Pt. Is at home. Dr. Darlyn Read is in his office.  Follow Up Instructions:   I discussed the assessment and treatment plan with the patient. The patient was provided an opportunity to ask questions and all were answered. The patient agreed with the plan and demonstrated an understanding of the instructions.   The patient was advised to call back or seek an in-person evaluation if the symptoms worsen or if the condition fails to improve as anticipated.   Total minutes including chart review and phone contact time: 8   Follow up plan: Return if symptoms worsen or fail to improve.  Mechele Claude, MD [facility]

## 2019-11-16 ENCOUNTER — Telehealth: Payer: Medicaid Other

## 2019-11-16 DIAGNOSIS — M10072 Idiopathic gout, left ankle and foot: Secondary | ICD-10-CM | POA: Diagnosis not present

## 2019-11-21 ENCOUNTER — Ambulatory Visit: Payer: Medicaid Other | Admitting: Family Medicine

## 2019-11-21 ENCOUNTER — Encounter: Payer: Self-pay | Admitting: Family Medicine

## 2019-11-21 ENCOUNTER — Other Ambulatory Visit: Payer: Self-pay

## 2019-11-21 ENCOUNTER — Ambulatory Visit (INDEPENDENT_AMBULATORY_CARE_PROVIDER_SITE_OTHER): Payer: Medicaid Other

## 2019-11-21 VITALS — BP 148/100 | HR 81 | Temp 97.6°F | Resp 20 | Ht 65.0 in | Wt 219.4 lb

## 2019-11-21 DIAGNOSIS — M7732 Calcaneal spur, left foot: Secondary | ICD-10-CM | POA: Diagnosis not present

## 2019-11-21 DIAGNOSIS — M19072 Primary osteoarthritis, left ankle and foot: Secondary | ICD-10-CM | POA: Diagnosis not present

## 2019-11-21 DIAGNOSIS — M7989 Other specified soft tissue disorders: Secondary | ICD-10-CM | POA: Diagnosis not present

## 2019-11-21 DIAGNOSIS — M79672 Pain in left foot: Secondary | ICD-10-CM | POA: Diagnosis not present

## 2019-11-21 MED ORDER — PREDNISONE 10 MG PO TABS: ORAL_TABLET | ORAL | 0 refills | Status: DC

## 2019-11-21 MED ORDER — TRAMADOL HCL 50 MG PO TABS: 50.0000 mg | ORAL_TABLET | Freq: Four times a day (QID) | ORAL | 0 refills | Status: AC

## 2019-11-21 NOTE — Progress Notes (Signed)
Chief Complaint  Patient presents with  . Left foot pain    HPI  Patient presents today for pain in the left foot.  She was seen on June 2 at urgent care and given an injection of Toradol plus dexamethasone.  She has not had significant with relief with that nor with the indomethacin that was prescribed there.  She was prescribed Colcrys but that was not covered by her insurance and she could not get it.  Currently she is having 10 of 10 pain at the base of the left first toe.  It is exquisitely tender to touch and the patient guards it and will not allow examination by palpation.  Only by inspection.  PMH: Smoking status noted ROS: Per HPI  Objective: BP (!) 148/100   Pulse 81   Temp 97.6 F (36.4 C) (Temporal)   Resp 20   Ht 5\' 5"  (1.651 m)   Wt 219 lb 6 oz (99.5 kg)   SpO2 97%   BMI 36.51 kg/m  Gen: NAD, alert, cooperative with exam HEENT: NCAT, EOMI, PERRL CV: RRR, good S1/S2, no murmur Resp: CTABL, no wheezes, non-labored Ext: left first toe erythematous centered at the first MTP medially.exquisitely tender Neuro: Alert and oriented, No gross deficits  Assessment and plan:  1. Left foot pain     Meds ordered this encounter  Medications  . predniSONE (DELTASONE) 10 MG tablet    Sig: Take 5 daily for 2 days followed by 4,3,2 and 1 for 2 days each.    Dispense:  30 tablet    Refill:  0  . traMADol (ULTRAM) 50 MG tablet    Sig: Take 1 tablet (50 mg total) by mouth 4 (four) times daily for 5 days. 1-2 tablets up to 4 times a day as needed for pain    Dispense:  20 tablet    Refill:  0    Orders Placed This Encounter  Procedures  . DG Foot Complete Left    Order Specific Question:   Reason for Exam (SYMPTOM  OR DIAGNOSIS REQUIRED)    Answer:   left foot pain    Order Specific Question:   Is the patient pregnant?    Answer:   No    Order Specific Question:   Preferred imaging location?    Answer:   Internal  . CBC with Differential/Platelet  . Uric acid  .  Sedimentation rate    Follow up as needed.  , MD

## 2020-01-09 ENCOUNTER — Encounter: Payer: Self-pay | Admitting: Nurse Practitioner

## 2020-01-09 ENCOUNTER — Ambulatory Visit (INDEPENDENT_AMBULATORY_CARE_PROVIDER_SITE_OTHER): Payer: Medicaid Other | Admitting: Nurse Practitioner

## 2020-01-09 DIAGNOSIS — J069 Acute upper respiratory infection, unspecified: Secondary | ICD-10-CM | POA: Diagnosis not present

## 2020-01-09 MED ORDER — BENZONATATE 100 MG PO CAPS
100.0000 mg | ORAL_CAPSULE | Freq: Three times a day (TID) | ORAL | 0 refills | Status: DC | PRN
Start: 2020-01-09 — End: 2020-02-07

## 2020-01-09 MED ORDER — AMOXICILLIN-POT CLAVULANATE 875-125 MG PO TABS
1.0000 | ORAL_TABLET | Freq: Two times a day (BID) | ORAL | 0 refills | Status: DC
Start: 2020-01-09 — End: 2020-02-07

## 2020-01-09 NOTE — Progress Notes (Signed)
Virtual Visit via telephone Note Due to COVID-19 pandemic this visit was conducted virtually. This visit type was conducted due to national recommendations for restrictions regarding the COVID-19 Pandemic (e.g. social distancing, sheltering in place) in an effort to limit this patient's exposure and mitigate transmission in our community. All issues noted in this document were discussed and addressed.  A physical exam was not performed with this format.  I connected with Toni Kim on 01/09/20 at 4:15 by telephone and verified that I am speaking with the correct person using two identifiers. Toni Kim is currently located at home and no one is currently with  her during visit. The provider, Mary-Margaret Daphine Deutscher, FNP is located in their office at time of visit.  I discussed the limitations, risks, security and privacy concerns of performing an evaluation and management service by telephone and the availability of in person appointments. I also discussed with the patient that there may be a patient responsible charge related to this service. The patient expressed understanding and agreed to proceed.   History and Present Illness:   Chief Complaint: URI   HPI Patient calls in c/o cough that started about 8 days ago. Facial pressure around nose with nasal congestion. Has a slight wheeze. She has been taking nyquil and dayquil which was not working so she took some mucinex. She had some amoxicillin at home and she took those, but only had 3 pills.    Review of Systems  Constitutional: Negative for chills and fever.  HENT: Positive for congestion and sinus pain. Negative for ear pain and sore throat.   Eyes: Negative for blurred vision, double vision, photophobia, pain, discharge and redness.  Respiratory: Positive for cough. Negative for shortness of breath.   Neurological: Negative for dizziness and headaches.  Psychiatric/Behavioral: Negative.   All other systems reviewed and are  negative.    Observations/Objective: Alert and oriented- answers all questions appropriately No distress Voice hoarse Deep wet cough noted   Assessment and Plan: Toni Kim in today with chief complaint of URI   1. Upper respiratory infection with cough and congestion 1. Take meds as prescribed 2. Use a cool mist humidifier especially during the winter months and when heat has been humid. 3. Use saline nose sprays frequently 4. Saline irrigations of the nose can be very helpful if done frequently.  * 4X daily for 1 week*  * Use of a nettie pot can be helpful with this. Follow directions with this* 5. Drink plenty of fluids 6. Keep thermostat turn down low 7.For any cough or congestion  Use plain Mucinex- regular strength or max strength is fine   * Children- consult with Pharmacist for dosing 8. For fever or aces or pains- take tylenol or ibuprofen appropriate for age and weight.  * for fevers greater than 101 orally you may alternate ibuprofen and tylenol every  3 hours.   Meds ordered this encounter  Medications  . amoxicillin-clavulanate (AUGMENTIN) 875-125 MG tablet    Sig: Take 1 tablet by mouth 2 (two) times daily.    Dispense:  14 tablet    Refill:  0    Order Specific Question:   Supervising Provider    Answer:   Arville Care A [id number]  . benzonatate (TESSALON PERLES) 100 MG capsule    Sig: Take 1 capsule (100 mg total) by mouth 3 (three) times daily as needed for cough.    Dispense:  20 capsule    Refill:  0  Order Specific Question:   Supervising Provider    Answer:   Arville Care A [[id number]]       Follow Up Instructions: prn    I discussed the assessment and treatment plan with the patient. The patient was provided an opportunity to ask questions and all were answered. The patient agreed with the plan and demonstrated an understanding of the instructions.   The patient was advised to call back or seek an in-person evaluation if the  symptoms worsen or if the condition fails to improve as anticipated.  The above assessment and management plan was discussed with the patient. The patient verbalized understanding of and has agreed to the management plan. Patient is aware to call the clinic if symptoms persist or worsen. Patient is aware when to return to the clinic for a follow-up visit. Patient educated on when it is appropriate to go to the emergency department.   Time call ended:  4:30 I provided 15 minutes of non-face-to-face time during this encounter.    Mary-Margaret Daphine Deutscher, FNP

## 2020-01-30 DIAGNOSIS — K219 Gastro-esophageal reflux disease without esophagitis: Secondary | ICD-10-CM | POA: Diagnosis not present

## 2020-01-30 DIAGNOSIS — F17210 Nicotine dependence, cigarettes, uncomplicated: Secondary | ICD-10-CM | POA: Diagnosis not present

## 2020-01-30 DIAGNOSIS — I10 Essential (primary) hypertension: Secondary | ICD-10-CM | POA: Diagnosis not present

## 2020-01-30 DIAGNOSIS — M25551 Pain in right hip: Secondary | ICD-10-CM | POA: Diagnosis not present

## 2020-01-30 DIAGNOSIS — S73101A Unspecified sprain of right hip, initial encounter: Secondary | ICD-10-CM | POA: Diagnosis not present

## 2020-01-30 DIAGNOSIS — W19XXXA Unspecified fall, initial encounter: Secondary | ICD-10-CM | POA: Diagnosis not present

## 2020-01-30 DIAGNOSIS — S8780XA Crushing injury of unspecified lower leg, initial encounter: Secondary | ICD-10-CM | POA: Diagnosis not present

## 2020-01-30 DIAGNOSIS — Z8249 Family history of ischemic heart disease and other diseases of the circulatory system: Secondary | ICD-10-CM | POA: Diagnosis not present

## 2020-02-07 ENCOUNTER — Ambulatory Visit (INDEPENDENT_AMBULATORY_CARE_PROVIDER_SITE_OTHER): Payer: Medicaid Other | Admitting: Family

## 2020-02-07 ENCOUNTER — Other Ambulatory Visit: Payer: Self-pay | Admitting: Family

## 2020-02-07 ENCOUNTER — Encounter: Payer: Self-pay | Admitting: Family

## 2020-02-07 DIAGNOSIS — I10 Essential (primary) hypertension: Secondary | ICD-10-CM

## 2020-02-07 DIAGNOSIS — Z09 Encounter for follow-up examination after completed treatment for conditions other than malignant neoplasm: Secondary | ICD-10-CM | POA: Diagnosis not present

## 2020-02-07 DIAGNOSIS — S76011D Strain of muscle, fascia and tendon of right hip, subsequent encounter: Secondary | ICD-10-CM

## 2020-02-07 DIAGNOSIS — B373 Candidiasis of vulva and vagina: Secondary | ICD-10-CM

## 2020-02-07 DIAGNOSIS — W19XXXD Unspecified fall, subsequent encounter: Secondary | ICD-10-CM

## 2020-02-07 DIAGNOSIS — F17200 Nicotine dependence, unspecified, uncomplicated: Secondary | ICD-10-CM

## 2020-02-07 DIAGNOSIS — B3731 Acute candidiasis of vulva and vagina: Secondary | ICD-10-CM

## 2020-02-07 MED ORDER — FLUCONAZOLE 150 MG PO TABS: 150.0000 mg | ORAL_TABLET | ORAL | 0 refills | Status: DC | PRN

## 2020-02-07 MED ORDER — HYDROCODONE-ACETAMINOPHEN 5-325 MG PO TABS: 1.0000 | ORAL_TABLET | Freq: Four times a day (QID) | ORAL | 0 refills | Status: DC | PRN

## 2020-02-07 NOTE — Telephone Encounter (Signed)
Seen today for hospital fu on hip, patient has only had acute visits in the last year Please advise

## 2020-02-07 NOTE — Telephone Encounter (Signed)
  Prescription Request  02/07/2020  What is the name of the medication or equipment? Hydrocholthyzide-b/p meds  Have you contacted your pharmacy to request a refill? (if applicable) Yes  Which pharmacy would you like this sent to? [facility]   Patient notified that their request is being sent to the clinical staff for review and that they should receive a response within 2 business days.   Pt did televisit today with [facility] today & she was suppose to [facility] in [facility].  Please call in meds & call pt when ready.

## 2020-02-07 NOTE — Progress Notes (Signed)
Virtual Visit via telephone Note Due to COVID-19 pandemic this visit was conducted virtually. This visit type was conducted due to national recommendations for restrictions regarding the COVID-19 Pandemic (e.g. social distancing, sheltering in place) in an effort to limit this patient's exposure and mitigate transmission in our community. All issues noted in this document were discussed and addressed.  A physical exam was not performed with this format.  I connected with Toni Kim on 02/07/20 at 10:10 AM  by telephone and verified that I am speaking with the correct person using two identifiers. Toni Kim is currently located at home and no one is currently with her during visit. The provider, Jannifer Rodney, FNP is located in their office at time of visit.  I discussed the limitations, risks, security and privacy concerns of performing an evaluation and management service by telephone and the availability of in person appointments. I also discussed with the patient that there may be a patient responsible charge related to this service. The patient expressed understanding and agreed to proceed.   History and Present Illness:   Pt calls the office today with UTI symptoms and hospital follow up. She states last week she fell in the [employer] and did a split.  When she fell she heard a loud pop and went to the ED. She had a negative x-ray.  She was discharged with motrin and Norco 5-325 mg #10.   She is also complaining of vaginal itching.  Fall The accident occurred more than 1 week ago. Pertinent negatives include no hematuria.  Vaginal Itching The patient's primary symptoms include genital itching and vaginal discharge. This is a new problem. Associated symptoms include dysuria. Pertinent negatives include no chills, constipation or hematuria.      Review of Systems  Constitutional: Negative for chills.  Gastrointestinal: Negative for constipation.  Genitourinary: Positive for  dysuria and vaginal discharge. Negative for hematuria.     Observations/Objective: No SOB or distress noted   Assessment and Plan: [name] comes in today with chief complaint of No chief complaint on file.   Diagnosis and orders addressed:  1. Strain of right hip, subsequent encounter -Continue Motrin Rest ROM exercises encouraged Will give one more rx of Norco for #20. Can not refill.  - HYDROcodone-acetaminophen (NORCO) 5-325 MG tablet; Take 1 tablet by mouth every 6 (six) hours as needed for moderate pain.  Dispense: 20 tablet; Refill: 0 - Ambulatory referral to Physical Therapy  2. Hospital discharge follow-up [facility] notes reviewed   3. Fall, subsequent encounter - Ambulatory referral to Physical Therapy  4. Vagina, candidiasis  - fluconazole (DIFLUCAN) 150 MG tablet; Take 1 tablet (150 mg total) by mouth every three (3) days as needed.  Dispense: 3 tablet; Refill: 0  5. Current smoker   Health Maintenance reviewed Diet and exercise encouraged     I discussed the assessment and treatment plan with the patient. The patient was provided an opportunity to ask questions and all were answered. The patient agreed with the plan and demonstrated an understanding of the instructions.   The patient was advised to call back or seek an in-person evaluation if the symptoms worsen or if the condition fails to improve as anticipated.  The above assessment and management plan was discussed with the patient. The patient verbalized understanding of and has agreed to the management plan. Patient is aware to call the clinic if symptoms persist or worsen. Patient is aware when to return to the clinic for a follow-up visit.  Patient educated on when it is appropriate to go to the emergency department.   Time call ended:  10:26 AM  I provided 16 minutes of non-face-to-face time during this encounter.    Jannifer Rodney, FNP

## 2020-02-08 MED ORDER — HYDROCHLOROTHIAZIDE 12.5 MG PO CAPS: 12.5000 mg | ORAL_CAPSULE | Freq: Every day | ORAL | 1 refills | Status: DC

## 2020-04-04 ENCOUNTER — Telehealth: Payer: Self-pay

## 2020-04-04 NOTE — Telephone Encounter (Signed)
Okay for note

## 2020-04-05 NOTE — Telephone Encounter (Signed)
That is fine, go ahead and do the work note for her.

## 2020-04-05 NOTE — Telephone Encounter (Signed)
Attempted to contact patient - NA  Work note printed and will be placed up front   Please let patient know when she returns call

## 2020-06-11 DIAGNOSIS — Z1231 Encounter for screening mammogram for malignant neoplasm of breast: Secondary | ICD-10-CM | POA: Diagnosis not present

## 2021-01-15 ENCOUNTER — Ambulatory Visit: Payer: Medicaid Other | Admitting: Family

## 2021-01-15 ENCOUNTER — Other Ambulatory Visit: Payer: Self-pay

## 2021-01-15 ENCOUNTER — Other Ambulatory Visit (HOSPITAL_COMMUNITY)
Admission: RE | Admit: 2021-01-15 | Discharge: 2021-01-15 | Disposition: A | Discharge status: Home / Self Care | Payer: Medicaid Other | Source: Ambulatory Visit | Attending: Family | Admitting: Family

## 2021-01-15 VITALS — BP 158/96 | HR 89 | Temp 97.4°F | Ht 65.0 in | Wt 204.4 lb

## 2021-01-15 DIAGNOSIS — A599 Trichomoniasis, unspecified: Secondary | ICD-10-CM | POA: Diagnosis not present

## 2021-01-15 DIAGNOSIS — N760 Acute vaginitis: Secondary | ICD-10-CM | POA: Diagnosis not present

## 2021-01-15 DIAGNOSIS — B379 Candidiasis, unspecified: Secondary | ICD-10-CM | POA: Diagnosis not present

## 2021-01-15 DIAGNOSIS — N898 Other specified noninflammatory disorders of vagina: Secondary | ICD-10-CM

## 2021-01-15 DIAGNOSIS — B9689 Other specified bacterial agents as the cause of diseases classified elsewhere: Secondary | ICD-10-CM

## 2021-01-15 LAB — WET PREP FOR TRICH, YEAST, CLUE
Clue Cell Exam: POSITIVE — AB
Trichomonas Exam: POSITIVE — AB
Yeast Exam: NEGATIVE

## 2021-01-15 MED ORDER — METRONIDAZOLE 500 MG PO TABS: 500.0000 mg | ORAL_TABLET | Freq: Two times a day (BID) | ORAL | 0 refills | Status: DC

## 2021-01-15 NOTE — Addendum Note (Signed)
Addended by: Austin Miles F on: 01/15/2021 12:07 PM   Modules accepted: Orders

## 2021-01-15 NOTE — Progress Notes (Signed)
   Subjective:    Patient ID: Toni Kim, female    DOB: 03-10-1976, 45 y.o.   MRN: [id number]  Chief Complaint  Patient presents with   Vaginal Itching    Vaginal Itching The patient's primary symptoms include genital itching. The patient's pertinent negatives include no genital odor, vaginal bleeding or vaginal discharge. This is a new problem. The current episode started in the past 7 days. The patient is experiencing no pain. She has tried nothing for the symptoms.     Review of Systems  Genitourinary:  Negative for vaginal discharge.  All other systems reviewed and are negative.     Objective:   Physical Exam Vitals reviewed.  Constitutional:      General: She is not in acute distress.    Appearance: She is well-developed.  HENT:     Head: Normocephalic and atraumatic.     Right Ear: Tympanic membrane normal.     Left Ear: Tympanic membrane normal.  Eyes:     Pupils: Pupils are equal, round, and reactive to light.  Neck:     Thyroid: No thyromegaly.  Cardiovascular:     Rate and Rhythm: Normal rate and regular rhythm.     Heart sounds: Normal heart sounds. No murmur heard. Pulmonary:     Effort: Pulmonary effort is normal. No respiratory distress.     Breath sounds: Normal breath sounds. No wheezing.  Abdominal:     General: Bowel sounds are normal. There is no distension.     Palpations: Abdomen is soft.     Tenderness: There is no abdominal tenderness.  Musculoskeletal:        General: No tenderness. Normal range of motion.     Cervical back: Normal range of motion and neck supple.  Skin:    General: Skin is warm and dry.  Neurological:     Mental Status: She is alert and oriented to person, place, and time.     Cranial Nerves: No cranial nerve deficit.     Deep Tendon Reflexes: Reflexes are normal and symmetric.  Psychiatric:        Behavior: Behavior normal.        Thought Content: Thought content normal.        Judgment: Judgment normal.     BP  (!) 158/96   Pulse 89   Temp (!) 97.4 F (36.3 C) (Temporal)   Ht 5\' 5"  (1.651 m)   Wt 204 lb 6.4 oz (92.7 kg)   SpO2 99%   BMI 34.01 kg/m       Assessment & Plan:   [name] comes in today with chief complaint of Vaginal Itching   Diagnosis and orders addressed:  1. Vagina itching - WET PREP FOR TRICH, YEAST, CLUE - STD Screen (8) - Urine cytology ancillary only  2. Trichimoniasis - STD Screen (8) - metroNIDAZOLE (FLAGYL) 500 MG tablet; Take 1 tablet (500 mg total) by mouth 2 (two) times daily.  Dispense: 14 tablet; Refill: 0 - Urine cytology ancillary only  3. Bacterial vaginitis - STD Screen (8) - metroNIDAZOLE (FLAGYL) 500 MG tablet; Take 1 tablet (500 mg total) by mouth 2 (two) times daily.  Dispense: 14 tablet; Refill: 0 - Urine cytology ancillary only   Start Flagyl today Safe sex No sex until completion of antibiotic. Partner needs to be treated.  Will do full panel to rule out STI RTO in 3 months for retesting.    Alease Frame, FNP

## 2021-01-15 NOTE — Patient Instructions (Signed)

## 2021-01-16 LAB — URINE CYTOLOGY ANCILLARY ONLY
Candida Urine: NEGATIVE
Chlamydia: NEGATIVE
Comment: NEGATIVE
Comment: NORMAL
Neisseria Gonorrhea: NEGATIVE

## 2021-01-16 LAB — HIV ANTIBODY (ROUTINE TESTING W REFLEX): HIV Screen 4th Generation wRfx: NONREACTIVE

## 2021-01-16 LAB — RPR: RPR Ser Ql: NONREACTIVE

## 2021-04-19 IMAGING — DX DG FOOT COMPLETE 3+V*L*
3 series · 3 of 3 positions shown · non-contrast
Comparison: Left ankle 07/19/2012

CLINICAL DATA: Left foot pain.

EXAM:
LEFT FOOT - COMPLETE 3+ VIEW

[foot ap]
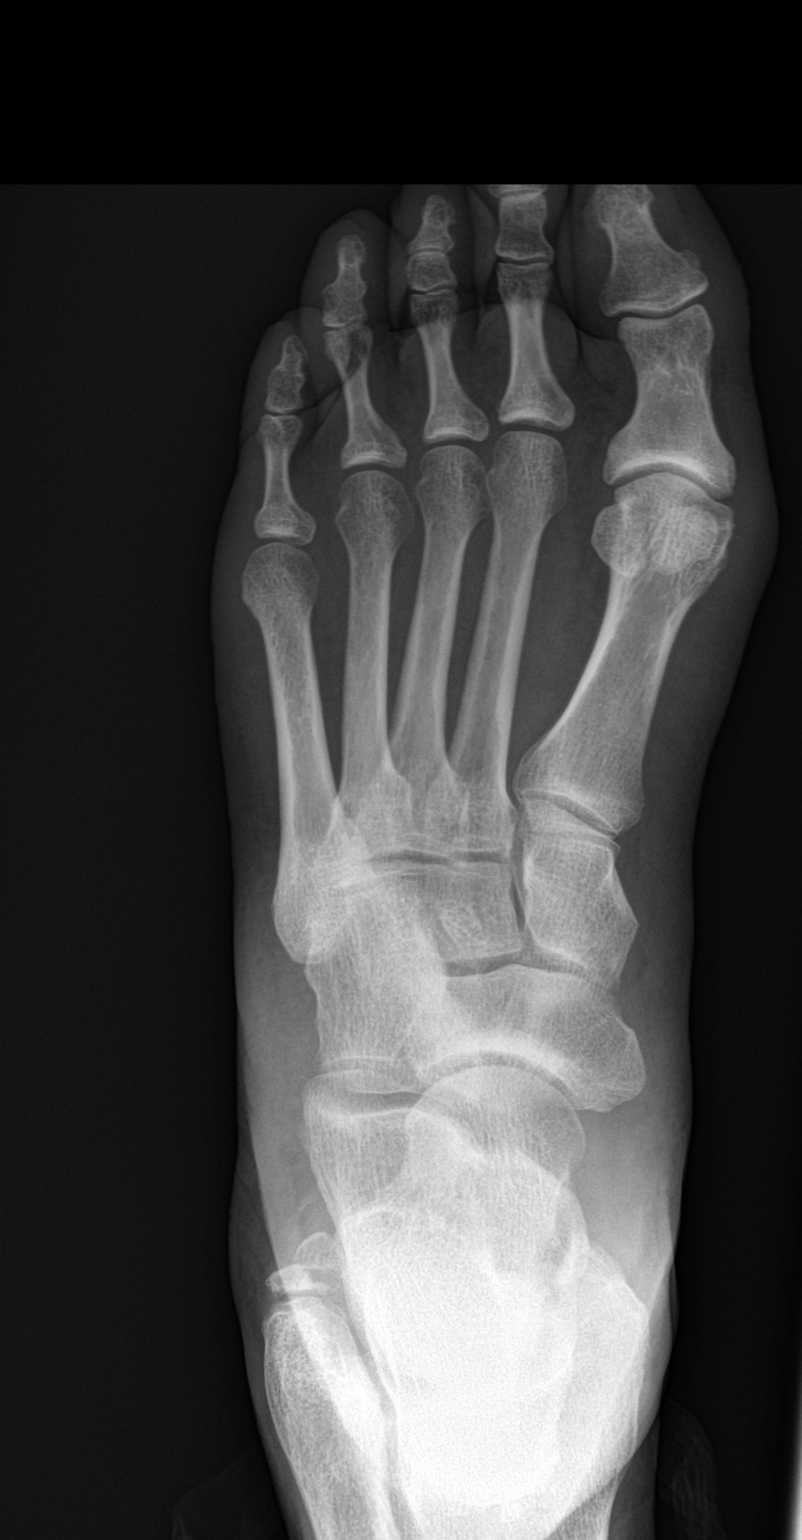

[foot obl]
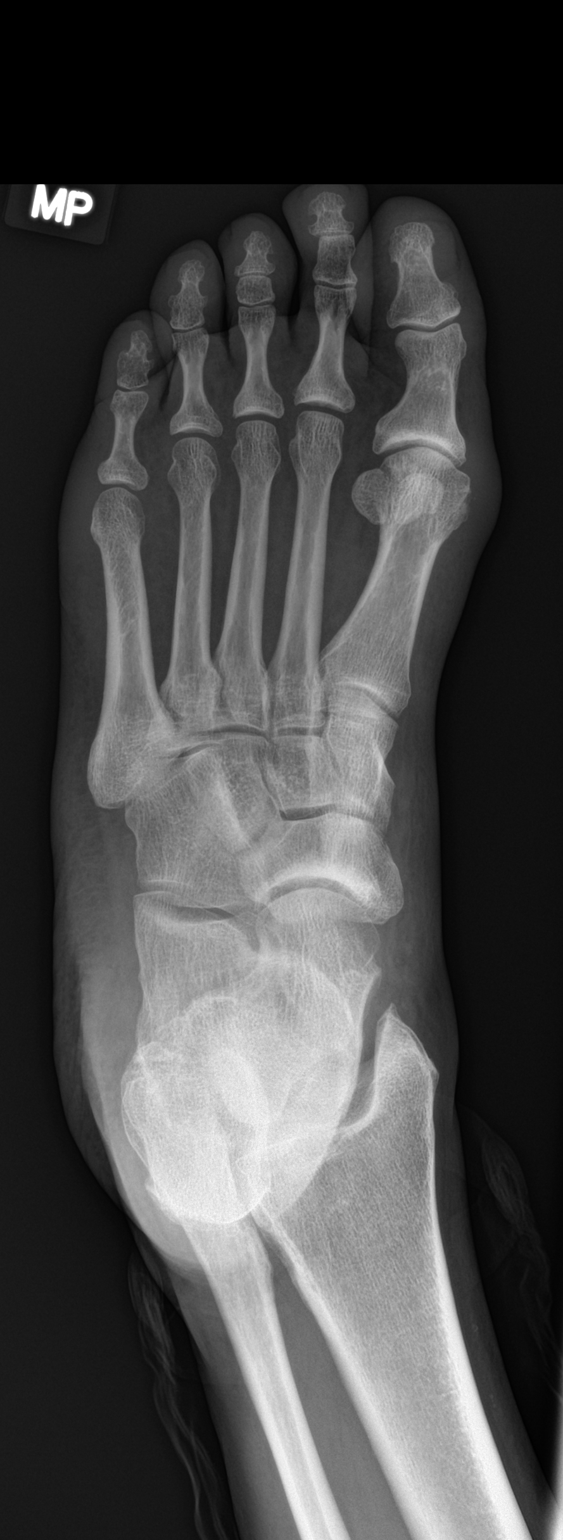

[foot lat]
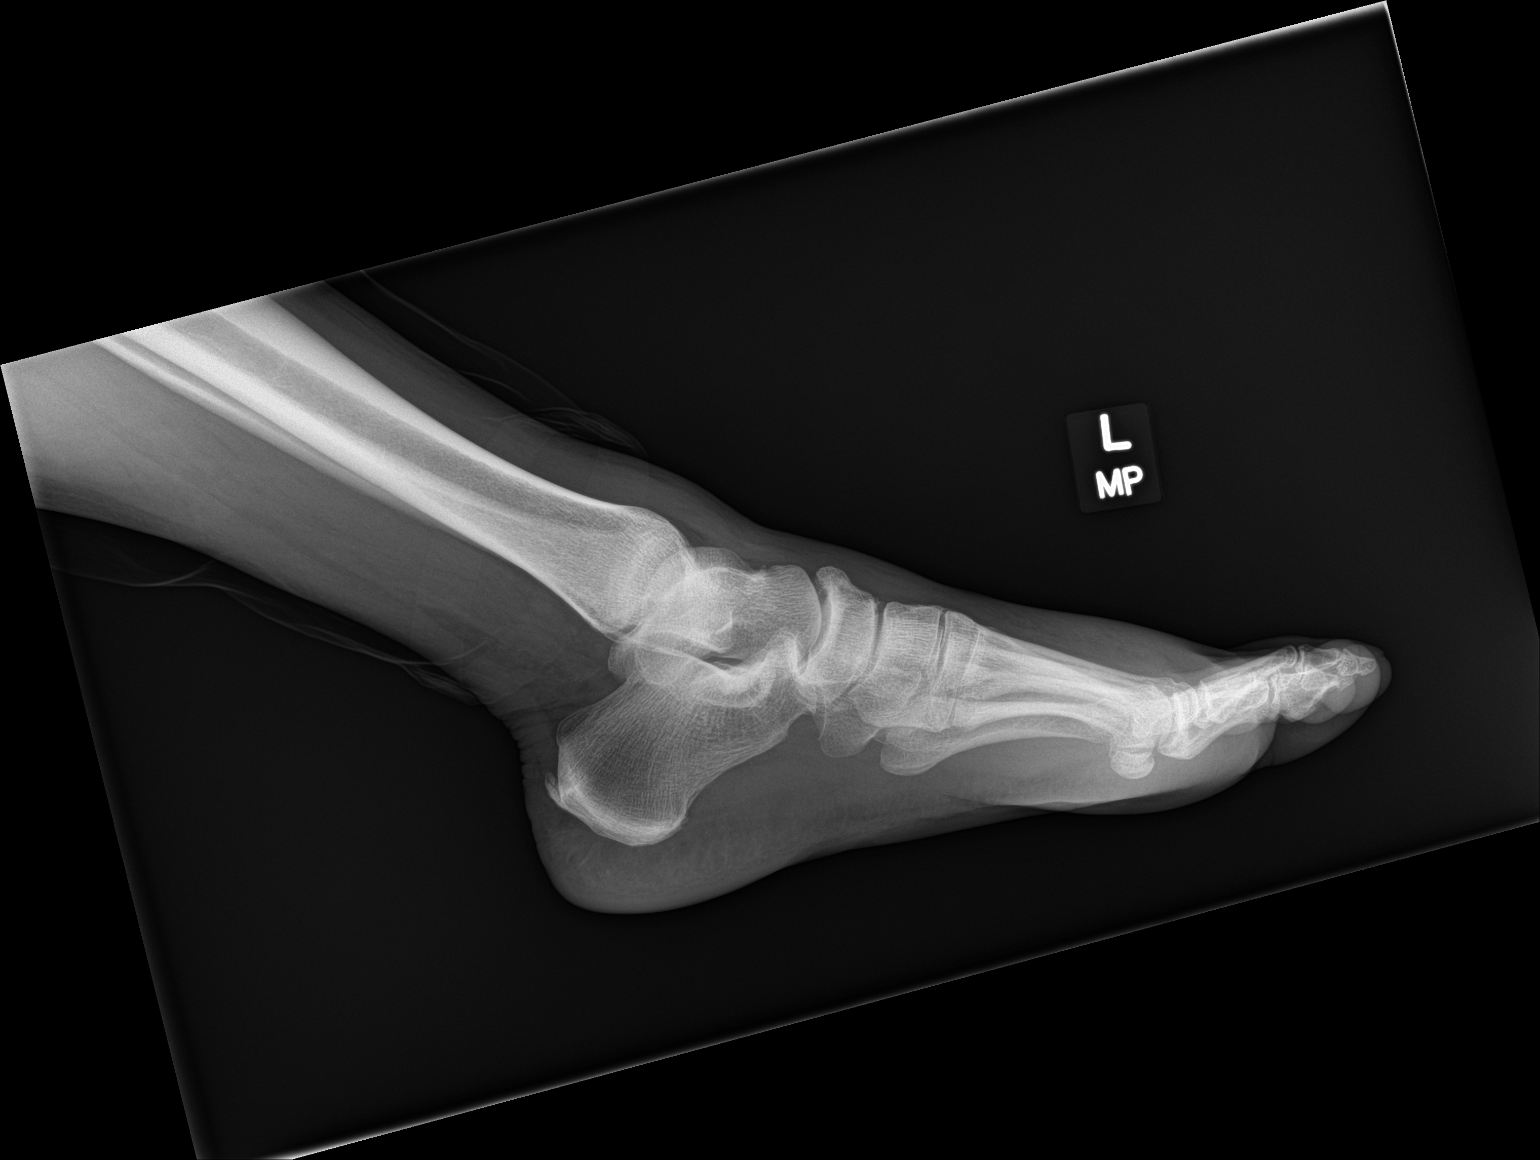

[3 of 3 positions shown; findings below may reference images not displayed]

FINDINGS: Remote fracture deformity involving the lateral malleolus is
identified. Mild hallux valgus deformity with degenerative changes
at the first MTP joint. Posterior calcaneal heel spur. Dorsal soft
tissue swelling noted.
IMPRESSION: 1. Remote fracture deformity involving the lateral malleolus.
2. Dorsal soft tissue swelling.
3. Heel spur.
4. Hallux valgus deformity with mild degenerative changes at the
first MTP joint.

## 2021-09-02 DIAGNOSIS — Z1231 Encounter for screening mammogram for malignant neoplasm of breast: Secondary | ICD-10-CM | POA: Diagnosis not present

## 2021-09-06 DIAGNOSIS — R928 Other abnormal and inconclusive findings on diagnostic imaging of breast: Secondary | ICD-10-CM | POA: Diagnosis not present

## 2021-09-06 DIAGNOSIS — R922 Inconclusive mammogram: Secondary | ICD-10-CM | POA: Diagnosis not present

## 2021-10-21 ENCOUNTER — Encounter: Payer: Self-pay | Admitting: Family

## 2021-10-21 ENCOUNTER — Telehealth: Payer: Self-pay | Admitting: Family

## 2021-10-21 ENCOUNTER — Ambulatory Visit (INDEPENDENT_AMBULATORY_CARE_PROVIDER_SITE_OTHER): Payer: Medicaid Other

## 2021-10-21 ENCOUNTER — Ambulatory Visit: Payer: Medicaid Other | Admitting: Family

## 2021-10-21 VITALS — BP 126/83 | HR 76 | Temp 97.8°F | Ht 65.0 in | Wt 186.8 lb

## 2021-10-21 DIAGNOSIS — M5441 Lumbago with sciatica, right side: Secondary | ICD-10-CM

## 2021-10-21 DIAGNOSIS — Z1159 Encounter for screening for other viral diseases: Secondary | ICD-10-CM | POA: Diagnosis not present

## 2021-10-21 DIAGNOSIS — Z0001 Encounter for general adult medical examination with abnormal findings: Secondary | ICD-10-CM

## 2021-10-21 DIAGNOSIS — M25551 Pain in right hip: Secondary | ICD-10-CM

## 2021-10-21 DIAGNOSIS — Z0000 Encounter for general adult medical examination without abnormal findings: Secondary | ICD-10-CM | POA: Diagnosis not present

## 2021-10-21 DIAGNOSIS — G5603 Carpal tunnel syndrome, bilateral upper limbs: Secondary | ICD-10-CM

## 2021-10-21 DIAGNOSIS — I10 Essential (primary) hypertension: Secondary | ICD-10-CM

## 2021-10-21 DIAGNOSIS — M160 Bilateral primary osteoarthritis of hip: Secondary | ICD-10-CM | POA: Diagnosis not present

## 2021-10-21 MED ORDER — HYDROCHLOROTHIAZIDE 12.5 MG PO CAPS: 12.5000 mg | ORAL_CAPSULE | Freq: Every day | ORAL | 1 refills | Status: DC

## 2021-10-21 MED ORDER — IBUPROFEN 600 MG PO TABS: 600.0000 mg | ORAL_TABLET | Freq: Three times a day (TID) | ORAL | 2 refills | Status: DC | PRN

## 2021-10-21 MED ORDER — DICLOFENAC SODIUM 75 MG PO TBEC: 75.0000 mg | DELAYED_RELEASE_TABLET | Freq: Two times a day (BID) | ORAL | 0 refills | Status: DC

## 2021-10-21 MED ORDER — PREDNISONE 10 MG (21) PO TBPK: ORAL_TABLET | ORAL | 0 refills | Status: DC

## 2021-10-21 NOTE — Telephone Encounter (Signed)
I sent in ibuprofen 600 mg.  ?

## 2021-10-21 NOTE — Telephone Encounter (Signed)
Patient informed. 

## 2021-10-21 NOTE — Patient Instructions (Signed)

## 2021-10-21 NOTE — Progress Notes (Signed)
? ?Subjective:  ? ? Patient ID: Toni Kim, female    DOB: Jul 31, 1975, 46 y.o.   MRN: [id number] ? ?Chief Complaint  ?Patient presents with  ? Back Pain  ? Leg Pain  ?  Right side no better since she fell at laundry mat beginning of year  ? Numbness  ?  Hands ?  ? ?PT presents to the office today for CPE without pap. She is complaining of back pain and bilateral hand numbness.  ? ?She reports her hands have been hurting for over a year. She was diagnosed with carpal tunnel. States she saw a specialists, but never went back. States her symptoms are now worse and can not sleep through the night and dropping things.   ?Back Pain ?This is a chronic problem. The current episode started more than 1 year ago. The problem occurs constantly. The pain is present in the lumbar spine. The quality of the pain is described as aching. The pain radiates to the right foot. The pain is at a severity of 10/10. The pain is moderate. The symptoms are aggravated by sitting and standing. Associated symptoms include leg pain, numbness and weakness. She has tried bed rest, walking and muscle relaxant for the symptoms. The treatment provided mild relief.  ?Leg Pain  ?Associated symptoms include numbness.  ?Hypertension ?This is a chronic problem. The current episode started more than 1 year ago. The problem has been resolved since onset. The problem is controlled. Associated symptoms include malaise/fatigue. Pertinent negatives include no peripheral edema or shortness of breath. Past treatments include diuretics. The current treatment provides moderate improvement.  ?Nicotine Dependence ?Presents for follow-up visit. Her urge triggers include company of smokers. The symptoms have been stable. She smokes 1 pack of cigarettes per day.  ? ? ? ?Review of Systems  ?Constitutional:  Positive for malaise/fatigue.  ?Respiratory:  Negative for shortness of breath.   ?Musculoskeletal:  Positive for back pain.  ?Neurological:  Positive for weakness  and numbness.  ?All other systems reviewed and are negative. ? ?Family History  ?Problem Relation Age of Onset  ? Heart disease Mother   ?     MI  ? ?Social History  ? ?Socioeconomic History  ? Marital status: Single  ?  Spouse name: Not on file  ? Number of children: Not on file  ? Years of education: Not on file  ? Highest education level: Not on file  ?Occupational History  ? Not on file  ?Tobacco Use  ? Smoking status: Every Day  ?  Packs/day: 0.50  ?  Years: 10.00  ?  Pack years: 5.00  ?  Types: Cigarettes  ? Smokeless tobacco: Never  ?Vaping Use  ? Vaping Use: Never used  ?Substance and Sexual Activity  ? Alcohol use: No  ? Drug use: Not Currently  ?  Types: Marijuana  ?  Comment: last 03/23/2017-uses for pain Quit yrs ago  ? Sexual activity: Not on file  ?Other Topics Concern  ? Not on file  ?Social History Narrative  ? Not on file  ? ?Social Determinants of Health  ? ?Financial Resource Strain: Not on file  ?Food Insecurity: Not on file  ?Transportation Needs: Not on file  ?Physical Activity: Not on file  ?Stress: Not on file  ?Social Connections: Not on file  ?Intimate Partner Violence: Not on file  ? ? ?   ?Objective:  ? Physical Exam ?Vitals reviewed.  ?Constitutional:   ?   General: She is not in  acute distress. ?   Appearance: She is well-developed. She is obese.  ?HENT:  ?   Head: Normocephalic and atraumatic.  ?   Right Ear: Tympanic membrane normal.  ?   Left Ear: Tympanic membrane normal.  ?Eyes:  ?   Pupils: Pupils are equal, round, and reactive to light.  ?Neck:  ?   Thyroid: No thyromegaly.  ?Cardiovascular:  ?   Rate and Rhythm: Normal rate and regular rhythm.  ?   Heart sounds: Normal heart sounds. No murmur heard. ?Pulmonary:  ?   Effort: Pulmonary effort is normal. No respiratory distress.  ?   Breath sounds: Normal breath sounds. No wheezing.  ?Abdominal:  ?   General: Bowel sounds are normal. There is no distension.  ?   Palpations: Abdomen is soft.  ?   Tenderness: There is no abdominal  tenderness.  ?Musculoskeletal:     ?   General: Tenderness present. No signs of injury.  ?   Cervical back: Normal range of motion and neck supple.  ?   Comments: Positive tinel and phalen sign, pain in right hip with external rotation  ?Skin: ?   General: Skin is warm and dry.  ?Neurological:  ?   Mental Status: She is alert and oriented to person, place, and time.  ?   Cranial Nerves: No cranial nerve deficit.  ?   Deep Tendon Reflexes: Reflexes are normal and symmetric.  ?Psychiatric:     ?   Behavior: Behavior normal.     ?   Thought Content: Thought content normal.     ?   Judgment: Judgment normal.  ? ? ? ? ?BP 126/83   Pulse 76   Temp 97.8 ?F (36.6 ?C) (Oral)   Ht '5\' 5"'  (1.651 m)   Wt 186 lb 12.8 oz (84.7 kg)   SpO2 97%   BMI 31.09 kg/m?  ? ?   ?Assessment & Plan:  ?Chanita Boden comes in today with chief complaint of Back Pain, Leg Pain (Right side no better since she fell at laundry mat beginning of year), and Numbness (Hands/) ? ? ?Diagnosis and orders addressed: ? ?1. Essential hypertension ?- hydrochlorothiazide (MICROZIDE) 12.5 MG capsule; Take 1 capsule (12.5 mg total) by mouth daily.  Dispense: 90 capsule; Refill: 1 ?- CMP14+EGFR ?- CBC with Differential/Platelet ? ?2. Right-sided low back pain with right-sided sciatica, unspecified chronicity ?X-ray pending ?Start prednisone and Diclofenac  ?No other NSAIDs ?If x-ray negative will send to PT ?- CMP14+EGFR ?- CBC with Differential/Platelet ?- DG HIP UNILAT W OR W/O PELVIS 2-3 VIEWS RIGHT ?- predniSONE (STERAPRED UNI-PAK 21 TAB) 10 MG (21) TBPK tablet; Use as directed  Dispense: 21 tablet; Refill: 0 ?- diclofenac (VOLTAREN) 75 MG EC tablet; Take 1 tablet (75 mg total) by mouth 2 (two) times daily.  Dispense: 30 tablet; Refill: 0 ? ?3. Bilateral carpal tunnel syndrome ?Wear wrist brace while sleeping and working ?Start prednisone and diclofenac ?- CMP14+EGFR ?- CBC with Differential/Platelet ?- predniSONE (STERAPRED UNI-PAK 21 TAB) 10 MG (21) TBPK  tablet; Use as directed  Dispense: 21 tablet; Refill: 0 ?- diclofenac (VOLTAREN) 75 MG EC tablet; Take 1 tablet (75 mg total) by mouth 2 (two) times daily.  Dispense: 30 tablet; Refill: 0 ?- Ambulatory referral to Hand Surgery ? ?4. Annual physical exam ?- CMP14+EGFR ?- CBC with Differential/Platelet ?- Lipid panel ?- TSH ?- Hepatitis C antibody ? ?5. Right hip pain ?- CMP14+EGFR ?- CBC with Differential/Platelet ?- predniSONE (STERAPRED UNI-PAK 21 TAB)  10 MG (21) TBPK tablet; Use as directed  Dispense: 21 tablet; Refill: 0 ?- diclofenac (VOLTAREN) 75 MG EC tablet; Take 1 tablet (75 mg total) by mouth 2 (two) times daily.  Dispense: 30 tablet; Refill: 0 ? ?6. Need for hepatitis C screening test ?- CMP14+EGFR ?- CBC with Differential/Platelet ?- Hepatitis C antibody ? ? ?Labs pending ?Health Maintenance reviewed ?Diet and exercise encouraged ? ?Follow up plan: ?6 months  ? ? ?Evelina Dun, FNP ? ? ?

## 2021-10-22 ENCOUNTER — Other Ambulatory Visit: Payer: Self-pay | Admitting: Family

## 2021-10-22 DIAGNOSIS — M25551 Pain in right hip: Secondary | ICD-10-CM

## 2021-10-22 DIAGNOSIS — M545 Low back pain, unspecified: Secondary | ICD-10-CM

## 2021-10-22 LAB — LIPID PANEL
Chol/HDL Ratio: 4.3 ratio (ref 0.0–4.4)
Cholesterol, Total: 233 mg/dL — ABNORMAL HIGH (ref 100–199)
HDL: 54 mg/dL
LDL Chol Calc (NIH): 154 mg/dL — ABNORMAL HIGH (ref 0–99)
Triglycerides: 138 mg/dL (ref 0–149)
VLDL Cholesterol Cal: 25 mg/dL (ref 5–40)

## 2021-10-22 LAB — CMP14+EGFR
ALT: 10 [IU]/L (ref 0–32)
AST: 18 [IU]/L (ref 0–40)
Albumin/Globulin Ratio: 1.2 (ref 1.2–2.2)
Albumin: 4.2 g/dL (ref 3.8–4.8)
Alkaline Phosphatase: 117 [IU]/L (ref 44–121)
BUN/Creatinine Ratio: 14 (ref 9–23)
BUN: 13 mg/dL (ref 6–24)
Bilirubin Total: <0.2 mg/dL (ref 0.0–1.2)
CO2: 23 mmol/L (ref 20–29)
Calcium: 9.1 mg/dL (ref 8.7–10.2)
Chloride: 107 mmol/L — ABNORMAL HIGH (ref 96–106)
Creatinine, Ser: 0.9 mg/dL (ref 0.57–1.00)
Globulin, Total: 3.4 g/dL (ref 1.5–4.5)
Glucose: 84 mg/dL (ref 70–99)
Potassium: 4.6 mmol/L (ref 3.5–5.2)
Sodium: 140 mmol/L (ref 134–144)
Total Protein: 7.6 g/dL (ref 6.0–8.5)
eGFR: 80 mL/min/{1.73_m2}

## 2021-10-22 LAB — CBC WITH DIFFERENTIAL/PLATELET
Basophils Absolute: 0.2 10*3/uL (ref 0.0–0.2)
Basos: 2 %
EOS (ABSOLUTE): 1.1 10*3/uL — ABNORMAL HIGH (ref 0.0–0.4)
Eos: 10 %
Hematocrit: 42 % (ref 34.0–46.6)
Hemoglobin: 13.5 g/dL (ref 11.1–15.9)
Immature Grans (Abs): 0 10*3/uL (ref 0.0–0.1)
Immature Granulocytes: 0 %
Lymphocytes Absolute: 3.2 10*3/uL — ABNORMAL HIGH (ref 0.7–3.1)
Lymphs: 28 %
MCH: 25.4 pg — ABNORMAL LOW (ref 26.6–33.0)
MCHC: 32.1 g/dL (ref 31.5–35.7)
MCV: 79 fL (ref 79–97)
Monocytes Absolute: 0.9 10*3/uL (ref 0.1–0.9)
Monocytes: 8 %
Neutrophils Absolute: 6 10*3/uL (ref 1.4–7.0)
Neutrophils: 52 %
Platelets: 283 10*3/uL (ref 150–450)
RBC: 5.32 x10E6/uL — ABNORMAL HIGH (ref 3.77–5.28)
RDW: 15.5 % — ABNORMAL HIGH (ref 11.7–15.4)
WBC: 11.4 10*3/uL — ABNORMAL HIGH (ref 3.4–10.8)

## 2021-10-22 LAB — TSH: TSH: 1.41 u[IU]/mL (ref 0.450–4.500)

## 2021-10-22 LAB — HEPATITIS C ANTIBODY: Hep C Virus Ab: NONREACTIVE

## 2021-10-22 NOTE — Addendum Note (Signed)
Addended by: Jannifer Rodney A on: 10/22/2021 03:40 PM ? ? Modules accepted: Orders ? ?

## 2021-10-24 ENCOUNTER — Other Ambulatory Visit: Payer: Self-pay | Admitting: Family

## 2021-10-24 DIAGNOSIS — M25551 Pain in right hip: Secondary | ICD-10-CM

## 2021-11-06 ENCOUNTER — Telehealth: Payer: Self-pay | Admitting: Family

## 2021-11-06 ENCOUNTER — Ambulatory Visit (INDEPENDENT_AMBULATORY_CARE_PROVIDER_SITE_OTHER): Payer: Medicaid Other | Admitting: *Deleted

## 2021-11-06 DIAGNOSIS — M5441 Lumbago with sciatica, right side: Secondary | ICD-10-CM

## 2021-11-06 DIAGNOSIS — Z111 Encounter for screening for respiratory tuberculosis: Secondary | ICD-10-CM

## 2021-11-06 DIAGNOSIS — M25551 Pain in right hip: Secondary | ICD-10-CM

## 2021-11-07 ENCOUNTER — Ambulatory Visit (INDEPENDENT_AMBULATORY_CARE_PROVIDER_SITE_OTHER): Payer: Medicaid Other

## 2021-11-07 ENCOUNTER — Encounter: Payer: Self-pay | Admitting: Orthopaedic Surgery

## 2021-11-07 ENCOUNTER — Ambulatory Visit: Payer: Medicaid Other | Admitting: Orthopaedic Surgery

## 2021-11-07 VITALS — Ht 64.0 in | Wt 187.0 lb

## 2021-11-07 DIAGNOSIS — G5603 Carpal tunnel syndrome, bilateral upper limbs: Secondary | ICD-10-CM

## 2021-11-07 DIAGNOSIS — M542 Cervicalgia: Secondary | ICD-10-CM | POA: Diagnosis not present

## 2021-11-07 DIAGNOSIS — G5601 Carpal tunnel syndrome, right upper limb: Secondary | ICD-10-CM | POA: Insufficient documentation

## 2021-11-07 NOTE — Telephone Encounter (Signed)
Insurance wants patient to do PT first before getting MRI.

## 2021-11-07 NOTE — Telephone Encounter (Signed)
Referral to PT placed again.

## 2021-11-07 NOTE — Addendum Note (Signed)
Addended by: Jannifer Rodney A on: 11/07/2021 04:50 PM   Modules accepted: Orders

## 2021-11-07 NOTE — Progress Notes (Addendum)
Office Visit Note   Patient: Toni Kim           Date of Birth: [birth year]           MRN: [id number] Visit Date: 11/07/2021              Requested by: Sharion Balloon, [address],  [place] [address] PCP: Sharion Balloon, FNP   Assessment & Plan: Visit Diagnoses:  1. Neck pain   2. Bilateral carpal tunnel syndrome     Plan: Patient is failed anti-inflammatories splinting persistent symptoms greater than 5 years with positive nerve conduction velocities for carpal tunnel syndrome right greater than left.  She like to proceed with right carpal tunnel release which would be done under some MAC with local.  Procedure discussed.  Questions elicited and answered risks were discussed she requests we proceed.  Follow-Up Instructions: No follow-ups on file.   Orders:  Orders Placed This Encounter  Procedures   XR Cervical Spine 2 or 3 views   No orders of the defined types were placed in this encounter.     Procedures: No procedures performed   Clinical Data: No additional findings.   Subjective: Chief Complaint  Patient presents with   Right Hand - Numbness   Left Hand - Numbness    HPI 46 year old female had previous cervical fusion by me October 2018 C4-C6.  Prior to that she had electrical test which did  show carpal tunnel syndrome right greater than left and her neck surgery was for cervical stenosis.  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  She has been using Tylenol arthritis but has persistent pain in her hip and some limping with increased activities.  She is in a sling splint sent on her wrist at night.  She states electrical test for carpal tunnel were very painful and she is here now wants to proceed with carpal tunnel release.  Last nerve conductions were greater than 5 years ago she has had persistent pain wakes her up at night she has to shake her  hands she has been wearing splints drops objects during the day.  No symptoms on ulnar one half fingers right or left.  Right hand symptoms are worse than left.  Review of Systems positive for treated hypertension.  Previous cervical fusion C4-C6.  Bilateral carpal tunnel syndrome.   Objective: Vital Signs: Ht _0  (1.626 m)   Wt 187 lb (84.8 kg)   BMI 32.10 kg/m   Physical Exam Constitutional:      Appearance: She is well-developed.  HENT:     Head: Normocephalic.     Right Ear: External ear normal.     Left Ear: External ear normal. There is no impacted cerumen.  Eyes:     Pupils: Pupils are equal, round, and reactive to light.  Neck:     Thyroid: No thyromegaly.     Trachea: No tracheal deviation.  Cardiovascular:     Rate and Rhythm: Normal rate.  Pulmonary:     Effort: Pulmonary effort is normal.  Abdominal:     Palpations: Abdomen is soft.  Musculoskeletal:     Cervical back: No rigidity.  Skin:    General: Skin is warm and dry.  Neurological:     Mental Status: She is alert and oriented to person, place, and time.  Psychiatric:  Behavior: Behavior normal.    Ortho Exam patient has mild thenar atrophy worse on the right than left.  Decreased two-point radial 3 and half fingers both right and left.  Positive Phalen's and carpal compression test right greater than left.  Ulnar nerve at the elbow is normal exam no brachial plexus tenderness well-healed anterior cervical incision.  Specialty Comments:  No specialty comments available.  Imaging: XR Cervical Spine 2 or 3 views  Result Date: 11/07/2021 AP lateral cervical spine images are obtained and reviewed.  This shows solid fusion C4-5 C5-6 with plate in good allograft cooperation.  Adjacent C3-4 endplate irregularity disc base narrowing and anterior spurring with uncovertebral sclerosis.  Minimal spurring at C6-7. Impression: Solid two-level cervical fusion C4-C6 with adjacent C3-4 spondylosis.    PMFS  History: Patient Active Problem List   Diagnosis Date Noted   Bilateral carpal tunnel syndrome 11/07/2021   S/P cervical spinal fusion 05/14/2017   Cervical spinal stenosis 03/27/2017   Cervical stenosis of spine 02/17/2017   Other spondylosis with radiculopathy, cervical region 01/08/2017   Essential hypertension 05/08/2015   Neck pain, chronic 09/18/2014   Back pain 12/01/2013   Past Medical History:  Diagnosis Date   Arthritis    GERD (gastroesophageal reflux disease)    H/O hiatal hernia    Headache    migranes   Hypertension     Family History  Problem Relation Age of Onset   Heart disease Mother        MI    Past Surgical History:  Procedure Laterality Date   ANTERIOR CERVICAL DECOMP/DISCECTOMY FUSION N/A 03/27/2017   Procedure: C4-5, C5-6 ANTERIOR CERVICAL DISCECTOMY AND FUSION, ALLOGRAFT, PLATE;  Surgeon: Marybelle Killings, MD;  Location: [facility];  Service: Orthopedics;  Laterality: N/A;   BACK SURGERY     CESAREAN SECTION     LUMBAR LAMINECTOMY/DECOMPRESSION MICRODISCECTOMY Right 12/21/2012   Procedure: LUMBAR LAMINECTOMY/DECOMPRESSION MICRODISCECTOMY 2 level;  Surgeon: Floyce Stakes, MD;  Location: [facility];  Service: Neurosurgery;  Laterality: Right;  Right Lumbar three-four,Lumbar four-five Microdiskectomy   SPINE SURGERY     TUBAL LIGATION     WISDOM TOOTH EXTRACTION     Social History   Occupational History   Not on file  Tobacco Use   Smoking status: Every Day    Packs/day: 0.50    Years: 10.00    Pack years: 5.00    Types: Cigarettes   Smokeless tobacco: Never  Vaping Use   Vaping Use: Never used  Substance and Sexual Activity   Alcohol use: No   Drug use: Not Currently    Types: Marijuana    Comment: last 03/23/2017-uses for pain Quit yrs ago   Sexual activity: Not on file

## 2021-11-07 NOTE — Telephone Encounter (Signed)
No voicemail set up 

## 2021-11-07 NOTE — Telephone Encounter (Signed)
Pt returned missed call. Reviewed PCP note with pt. Pt voiced understanding and needs PCP to send her somewhere for PT.

## 2021-11-08 LAB — TB SKIN TEST
Induration: 0 mm
TB Skin Test: NEGATIVE

## 2021-11-18 ENCOUNTER — Ambulatory Visit: Payer: Medicaid Other | Admitting: Physical Therapy

## 2021-11-21 ENCOUNTER — Encounter: Payer: Self-pay | Admitting: Family

## 2021-11-21 ENCOUNTER — Encounter (HOSPITAL_BASED_OUTPATIENT_CLINIC_OR_DEPARTMENT_OTHER): Payer: Self-pay | Admitting: Orthopaedic Surgery

## 2021-11-21 ENCOUNTER — Other Ambulatory Visit: Payer: Self-pay

## 2021-11-21 ENCOUNTER — Ambulatory Visit: Payer: Medicaid Other | Admitting: Family

## 2021-11-21 ENCOUNTER — Other Ambulatory Visit (HOSPITAL_COMMUNITY)
Admission: RE | Admit: 2021-11-21 | Discharge: 2021-11-21 | Disposition: A | Discharge status: Home / Self Care | Payer: Medicaid Other | Source: Ambulatory Visit | Attending: Family | Admitting: Family

## 2021-11-21 VITALS — BP 154/96 | HR 98 | Ht 65.0 in | Wt 186.8 lb

## 2021-11-21 DIAGNOSIS — G5603 Carpal tunnel syndrome, bilateral upper limbs: Secondary | ICD-10-CM

## 2021-11-21 DIAGNOSIS — Z01411 Encounter for gynecological examination (general) (routine) with abnormal findings: Secondary | ICD-10-CM | POA: Diagnosis not present

## 2021-11-21 DIAGNOSIS — I10 Essential (primary) hypertension: Secondary | ICD-10-CM | POA: Diagnosis not present

## 2021-11-21 DIAGNOSIS — Z01419 Encounter for gynecological examination (general) (routine) without abnormal findings: Secondary | ICD-10-CM

## 2021-11-21 NOTE — Progress Notes (Signed)
Subjective:    Patient ID: Toni Kim, female    DOB: April 11, 1976, 46 y.o.   MRN: [id number]  Chief Complaint  Patient presents with   Annual Exam   PT presents to the office today for pap.    She reports her hands have been hurting for over a year. She was diagnosed with carpal tunnel. She is scheduled for carpal tunnel surgery 12/02/21. Hypertension This is a chronic problem. The current episode started more than 1 year ago. The problem has been waxing and waning since onset. The problem is uncontrolled. Associated symptoms include malaise/fatigue. Pertinent negatives include no peripheral edema or shortness of breath. The current treatment provides mild improvement.  Gynecologic Exam The patient's primary symptoms include genital itching. The current episode started more than 1 year ago. The problem occurs intermittently. The patient is experiencing no pain.      Review of Systems  Constitutional:  Positive for malaise/fatigue.  Respiratory:  Negative for shortness of breath.   All other systems reviewed and are negative.  Family History  Problem Relation Age of Onset   Heart disease Mother        MI   Social History   Socioeconomic History   Marital status: Single    Spouse name: Not on file   Number of children: Not on file   Years of education: Not on file   Highest education level: Not on file  Occupational History   Not on file  Tobacco Use   Smoking status: Every Day    Packs/day: 0.50    Years: 10.00    Total pack years: 5.00    Types: Cigarettes   Smokeless tobacco: Never  Vaping Use   Vaping Use: Never used  Substance and Sexual Activity   Alcohol use: No   Drug use: Not Currently    Types: Marijuana    Comment: last 03/23/2017-uses for pain Quit yrs ago   Sexual activity: Not on file  Other Topics Concern   Not on file  Social History Narrative   Not on file   Social Determinants of Health   Financial Resource Strain: Not on file  Food  Insecurity: Not on file  Transportation Needs: Not on file  Physical Activity: Not on file  Stress: Not on file  Social Connections: Not on file       Objective:   Physical Exam Vitals reviewed.  Constitutional:      General: She is not in acute distress.    Appearance: She is well-developed. She is obese.  HENT:     Head: Normocephalic and atraumatic.     Right Ear: Tympanic membrane normal.     Left Ear: Tympanic membrane normal.  Eyes:     Pupils: Pupils are equal, round, and reactive to light.  Neck:     Thyroid: No thyromegaly.  Cardiovascular:     Rate and Rhythm: Normal rate and regular rhythm.     Heart sounds: Normal heart sounds. No murmur heard. Pulmonary:     Effort: Pulmonary effort is normal. No respiratory distress.     Breath sounds: Normal breath sounds. No wheezing.  Abdominal:     General: Bowel sounds are normal. There is no distension.     Palpations: Abdomen is soft.     Tenderness: There is no abdominal tenderness.  Genitourinary:    Comments: Bimanual exam- no adnexal masses or tenderness, ovaries nonpalpable   Cervix parous and pink- White discharge present Musculoskeletal:  General: No tenderness. Normal range of motion.     Cervical back: Normal range of motion and neck supple.  Skin:    General: Skin is warm and dry.  Neurological:     Mental Status: She is alert and oriented to person, place, and time.     Cranial Nerves: No cranial nerve deficit.     Deep Tendon Reflexes: Reflexes are normal and symmetric.  Psychiatric:        Behavior: Behavior normal.        Thought Content: Thought content normal.        Judgment: Judgment normal.     BP (!) 154/96   Pulse 98   Ht 5\' 5"  (1.651 m)   Wt 186 lb 12.8 oz (84.7 kg)   SpO2 100%   BMI 31.09 kg/m      Assessment & Plan:  Toni Kim comes in today with chief complaint of Annual Exam   Diagnosis and orders addressed:  1. Essential hypertension  2. Bilateral carpal  tunnel syndrome  3. Encounter for gynecological examination without abnormal finding - Cytology - PAP([facility])   Health Maintenance reviewed Diet and exercise encouraged  Follow up plan: 3 months    Alease Frame, FNP

## 2021-11-21 NOTE — Patient Instructions (Signed)

## 2021-11-25 LAB — CYTOLOGY - PAP
Adequacy: ABSENT
Chlamydia: NEGATIVE
Comment: NEGATIVE
Comment: NEGATIVE
Comment: NORMAL
Diagnosis: NEGATIVE
Diagnosis: REACTIVE
Neisseria Gonorrhea: NEGATIVE
Trichomonas: POSITIVE — AB

## 2021-11-26 ENCOUNTER — Other Ambulatory Visit: Payer: Self-pay | Admitting: Family

## 2021-11-26 MED ORDER — METRONIDAZOLE 500 MG PO TABS: 500.0000 mg | ORAL_TABLET | Freq: Three times a day (TID) | ORAL | 0 refills | Status: AC

## 2021-11-28 ENCOUNTER — Encounter (HOSPITAL_BASED_OUTPATIENT_CLINIC_OR_DEPARTMENT_OTHER)
Admission: RE | Admit: 2021-11-28 | Discharge: 2021-11-28 | Disposition: A | Discharge status: Home / Self Care | Payer: Medicaid Other | Source: Ambulatory Visit | Attending: Orthopaedic Surgery | Admitting: Orthopaedic Surgery

## 2021-11-28 DIAGNOSIS — G5601 Carpal tunnel syndrome, right upper limb: Secondary | ICD-10-CM | POA: Diagnosis not present

## 2021-11-28 DIAGNOSIS — F17210 Nicotine dependence, cigarettes, uncomplicated: Secondary | ICD-10-CM | POA: Diagnosis not present

## 2021-11-28 DIAGNOSIS — I10 Essential (primary) hypertension: Secondary | ICD-10-CM | POA: Diagnosis not present

## 2021-11-28 DIAGNOSIS — Z01818 Encounter for other preprocedural examination: Secondary | ICD-10-CM | POA: Insufficient documentation

## 2021-11-28 DIAGNOSIS — K219 Gastro-esophageal reflux disease without esophagitis: Secondary | ICD-10-CM | POA: Diagnosis not present

## 2021-11-28 DIAGNOSIS — Z79899 Other long term (current) drug therapy: Secondary | ICD-10-CM | POA: Diagnosis not present

## 2021-11-28 DIAGNOSIS — Z6831 Body mass index (BMI) 31.0-31.9, adult: Secondary | ICD-10-CM | POA: Diagnosis not present

## 2021-11-28 DIAGNOSIS — E669 Obesity, unspecified: Secondary | ICD-10-CM | POA: Diagnosis not present

## 2021-11-28 DIAGNOSIS — M1990 Unspecified osteoarthritis, unspecified site: Secondary | ICD-10-CM | POA: Diagnosis not present

## 2021-11-28 LAB — BASIC METABOLIC PANEL
Anion gap: 7 (ref 5–15)
BUN: 9 mg/dL (ref 6–20)
CO2: 24 mmol/L (ref 22–32)
Calcium: 8.9 mg/dL (ref 8.9–10.3)
Chloride: 106 mmol/L (ref 98–111)
Creatinine, Ser: 0.95 mg/dL (ref 0.44–1.00)
GFR, Estimated: >60 mL/min (ref >60)
Glucose, Bld: 84 mg/dL (ref 70–99)
Potassium: 4.7 mmol/L (ref 3.5–5.1)
Sodium: 137 mmol/L (ref 135–145)

## 2021-11-28 NOTE — Progress Notes (Signed)

## 2021-12-02 ENCOUNTER — Ambulatory Visit (HOSPITAL_BASED_OUTPATIENT_CLINIC_OR_DEPARTMENT_OTHER): Payer: Medicaid Other | Admitting: Anesthesiology

## 2021-12-02 ENCOUNTER — Encounter (HOSPITAL_BASED_OUTPATIENT_CLINIC_OR_DEPARTMENT_OTHER): Payer: Self-pay | Admitting: Orthopaedic Surgery

## 2021-12-02 ENCOUNTER — Ambulatory Visit (HOSPITAL_BASED_OUTPATIENT_CLINIC_OR_DEPARTMENT_OTHER)
Admission: RE | Admit: 2021-12-02 | Discharge: 2021-12-02 | Disposition: A | Discharge status: Home / Self Care | Payer: Medicaid Other | Attending: Orthopaedic Surgery | Admitting: Orthopaedic Surgery

## 2021-12-02 ENCOUNTER — Encounter (HOSPITAL_BASED_OUTPATIENT_CLINIC_OR_DEPARTMENT_OTHER)
Admission: RE | Disposition: A | Discharge status: Home / Self Care | Payer: Self-pay | Source: Home / Self Care | Attending: Orthopaedic Surgery

## 2021-12-02 ENCOUNTER — Other Ambulatory Visit: Payer: Self-pay

## 2021-12-02 DIAGNOSIS — F17210 Nicotine dependence, cigarettes, uncomplicated: Secondary | ICD-10-CM | POA: Diagnosis not present

## 2021-12-02 DIAGNOSIS — E669 Obesity, unspecified: Secondary | ICD-10-CM | POA: Diagnosis not present

## 2021-12-02 DIAGNOSIS — K449 Diaphragmatic hernia without obstruction or gangrene: Secondary | ICD-10-CM | POA: Diagnosis not present

## 2021-12-02 DIAGNOSIS — G5601 Carpal tunnel syndrome, right upper limb: Secondary | ICD-10-CM | POA: Insufficient documentation

## 2021-12-02 DIAGNOSIS — Z6831 Body mass index (BMI) 31.0-31.9, adult: Secondary | ICD-10-CM | POA: Insufficient documentation

## 2021-12-02 DIAGNOSIS — M1990 Unspecified osteoarthritis, unspecified site: Secondary | ICD-10-CM | POA: Insufficient documentation

## 2021-12-02 DIAGNOSIS — G709 Myoneural disorder, unspecified: Secondary | ICD-10-CM

## 2021-12-02 DIAGNOSIS — I10 Essential (primary) hypertension: Secondary | ICD-10-CM | POA: Diagnosis not present

## 2021-12-02 DIAGNOSIS — K219 Gastro-esophageal reflux disease without esophagitis: Secondary | ICD-10-CM | POA: Insufficient documentation

## 2021-12-02 DIAGNOSIS — Z01818 Encounter for other preprocedural examination: Secondary | ICD-10-CM

## 2021-12-02 DIAGNOSIS — Z79899 Other long term (current) drug therapy: Secondary | ICD-10-CM | POA: Insufficient documentation

## 2021-12-02 HISTORY — PX: CARPAL TUNNEL RELEASE: SHX101

## 2021-12-02 LAB — POCT PREGNANCY, URINE: Preg Test, Ur: NEGATIVE

## 2021-12-02 SURGERY — CARPAL TUNNEL RELEASE
Anesthesia: Monitor Anesthesia Care | Site: Hand | Laterality: Right

## 2021-12-02 MED ORDER — OXYCODONE HCL 5 MG PO TABS: 5.0000 mg | ORAL_TABLET | Freq: Once | ORAL | Status: DC | PRN

## 2021-12-02 MED ORDER — FENTANYL CITRATE (PF) 100 MCG/2ML IJ SOLN: 25.0000 ug | INTRAMUSCULAR | Status: DC | PRN

## 2021-12-02 MED ORDER — BUPIVACAINE HCL (PF) 0.25 % IJ SOLN
INTRAMUSCULAR | Status: AC
Start: 2021-12-02 — End: ?
  Filled 2021-12-02: qty 30

## 2021-12-02 MED ORDER — FENTANYL CITRATE (PF) 100 MCG/2ML IJ SOLN
INTRAMUSCULAR | Status: DC | PRN
  Administered 2021-12-02: 100 ug via INTRAVENOUS

## 2021-12-02 MED ORDER — OXYCODONE HCL 5 MG/5ML PO SOLN: 5.0000 mg | Freq: Once | ORAL | Status: DC | PRN

## 2021-12-02 MED ORDER — BUPIVACAINE HCL (PF) 0.25 % IJ SOLN
INTRAMUSCULAR | Status: DC | PRN
  Administered 2021-12-02: 6 mL

## 2021-12-02 MED ORDER — ONDANSETRON HCL 4 MG/2ML IJ SOLN
INTRAMUSCULAR | Status: AC
Start: 2021-12-02 — End: ?
  Filled 2021-12-02: qty 2

## 2021-12-02 MED ORDER — MIDAZOLAM HCL 2 MG/2ML IJ SOLN
INTRAMUSCULAR | Status: AC
  Filled 2021-12-02: qty 2

## 2021-12-02 MED ORDER — LACTATED RINGERS IV SOLN: INTRAVENOUS | Status: DC

## 2021-12-02 MED ORDER — 0.9 % SODIUM CHLORIDE (POUR BTL) OPTIME
TOPICAL | Status: DC | PRN
  Administered 2021-12-02: 1000 mL

## 2021-12-02 MED ORDER — HYDROCODONE-ACETAMINOPHEN 5-325 MG PO TABS: 1.0000 | ORAL_TABLET | Freq: Four times a day (QID) | ORAL | 0 refills | Status: DC | PRN

## 2021-12-02 MED ORDER — MIDAZOLAM HCL 5 MG/5ML IJ SOLN
INTRAMUSCULAR | Status: DC | PRN
  Administered 2021-12-02: 2 mg via INTRAVENOUS

## 2021-12-02 MED ORDER — CEFAZOLIN SODIUM-DEXTROSE 2-3 GM-%(50ML) IV SOLR
INTRAVENOUS | Status: DC | PRN
  Administered 2021-12-02: 2 g via INTRAVENOUS

## 2021-12-02 MED ORDER — FENTANYL CITRATE (PF) 100 MCG/2ML IJ SOLN
INTRAMUSCULAR | Status: AC
  Filled 2021-12-02: qty 2

## 2021-12-02 MED ORDER — ONDANSETRON HCL 4 MG/2ML IJ SOLN
INTRAMUSCULAR | Status: DC | PRN
  Administered 2021-12-02: 4 mg via INTRAVENOUS

## 2021-12-02 MED ORDER — PROPOFOL 500 MG/50ML IV EMUL
INTRAVENOUS | Status: DC | PRN
  Administered 2021-12-02: 200 ug/kg/min via INTRAVENOUS

## 2021-12-02 MED ORDER — ONDANSETRON HCL 4 MG/2ML IJ SOLN: 4.0000 mg | Freq: Once | INTRAMUSCULAR | Status: DC | PRN

## 2021-12-02 MED ORDER — LIDOCAINE HCL (PF) 1 % IJ SOLN
INTRAMUSCULAR | Status: AC
  Filled 2021-12-02: qty 30

## 2021-12-02 SURGICAL SUPPLY — 40 items
APL SKNCLS STERI-STRIP NONHPOA (GAUZE/BANDAGES/DRESSINGS)
BENZOIN TINCTURE PRP APPL 2/3 (GAUZE/BANDAGES/DRESSINGS) IMPLANT
BLADE SURG 15 STRL LF DISP TIS (BLADE) ×1 IMPLANT
BLADE SURG 15 STRL SS (BLADE) ×2
BNDG CMPR 9X4 STRL LF SNTH (GAUZE/BANDAGES/DRESSINGS) ×1
BNDG ELASTIC 4X5.8 VLCR STR LF (GAUZE/BANDAGES/DRESSINGS) ×2 IMPLANT
BNDG ESMARK 4X9 LF (GAUZE/BANDAGES/DRESSINGS) ×1 IMPLANT
CORD BIPOLAR FORCEPS 12FT (ELECTRODE) ×2 IMPLANT
COVER BACK TABLE 60X90IN (DRAPES) ×2 IMPLANT
COVER MAYO STAND STRL (DRAPES) ×2 IMPLANT
CUFF TOURN SGL QUICK 18X4 (TOURNIQUET CUFF) ×1 IMPLANT
DRAPE EXTREMITY T 121X128X90 (DISPOSABLE) ×2 IMPLANT
DRAPE SURG 17X23 STRL (DRAPES) ×2 IMPLANT
DRAPE U-SHAPE 47X51 STRL (DRAPES) ×1 IMPLANT
DURAPREP 26ML APPLICATOR (WOUND CARE) ×2 IMPLANT
GAUZE SPONGE 4X4 12PLY STRL (GAUZE/BANDAGES/DRESSINGS) ×2 IMPLANT
GAUZE XEROFORM 1X8 LF (GAUZE/BANDAGES/DRESSINGS) ×2 IMPLANT
GLOVE BIO SURGEON STRL SZ7.5 (GLOVE) ×2 IMPLANT
GLOVE BIOGEL PI IND STRL 8 (GLOVE) ×1 IMPLANT
GLOVE BIOGEL PI INDICATOR 8 (GLOVE) ×1
GOWN STRL REUS W/ TWL LRG LVL3 (GOWN DISPOSABLE) ×1 IMPLANT
GOWN STRL REUS W/ TWL XL LVL3 (GOWN DISPOSABLE) ×1 IMPLANT
GOWN STRL REUS W/TWL LRG LVL3 (GOWN DISPOSABLE) ×2
GOWN STRL REUS W/TWL XL LVL3 (GOWN DISPOSABLE) ×2
LOOP VESSEL MAXI BLUE (MISCELLANEOUS) IMPLANT
NDL HYPO 25X1 1.5 SAFETY (NEEDLE) IMPLANT
NEEDLE HYPO 25X1 1.5 SAFETY (NEEDLE) ×2 IMPLANT
NS IRRIG 1000ML POUR BTL (IV SOLUTION) ×2 IMPLANT
PACK BASIN DAY SURGERY FS (CUSTOM PROCEDURE TRAY) ×2 IMPLANT
PAD CAST 3X4 CTTN HI CHSV (CAST SUPPLIES) ×1 IMPLANT
PADDING CAST ABS 4INX4YD NS (CAST SUPPLIES)
PADDING CAST ABS COTTON 4X4 ST (CAST SUPPLIES) ×1 IMPLANT
PADDING CAST COTTON 3X4 STRL (CAST SUPPLIES) ×2
STOCKINETTE 4X48 STRL (DRAPES) ×2 IMPLANT
STRIP CLOSURE SKIN 1/2X4 (GAUZE/BANDAGES/DRESSINGS) IMPLANT
SUT ETHILON 3 0 PS 1 (SUTURE) ×2 IMPLANT
SYR BULB EAR ULCER 3OZ GRN STR (SYRINGE) ×2 IMPLANT
SYR CONTROL 10ML LL (SYRINGE) ×1 IMPLANT
TOWEL GREEN STERILE FF (TOWEL DISPOSABLE) ×2 IMPLANT
UNDERPAD 30X36 HEAVY ABSORB (UNDERPADS AND DIAPERS) ×2 IMPLANT

## 2021-12-02 NOTE — Op Note (Signed)
Preop diagnosis: Right carpal tunnel syndrome  Postop diagnosis: Same  Procedure: Right carpal tunnel release.  Surgeon: Annell Greening MD  Anesthesia: IV sedation plus local  Tourniquet 250 times less than 10 minutes.  Findings: Chronic median nerve compression in the carpal canal.  Brief history 46 year old female previous cervical fusion C4-C6 solid fusion done in 2018 with positive electrical test for bilateral carpal tunnel worse on the right hand the left hand done 5 years ago.  She has had persistent problems with hand numbness that wakes her up at night did not respond to anti-inflammatories as well as splinting and now she is here for carpal tunnel release.  Procedure: After standard prepping and draping IV sedation DuraPrep the usual extremity sheets and drapes sterile skin marker was used for planned incision based on landmarks cardinal Lines.  Marcaine Was Infiltrated Total of 6 Cc.  Hand Was Elevated Wrapped in Esmarch Tourniquet Inflated to 250.  Timeout Procedure Was Completed No Antibiotics Are Indicated.  Incision Was Made and Subtenons Tissue Was Sharply Divided.  Palmaris Brevis Was Split Transverse Carpal Ligament Was Nicked and Then Extended along the Ulnar Aspect of the Median Nerve.  Thenar Branch Was Easily Visualized and Had a Distal Radial Takeoff.  There Is Chronic Hourglass Deformity of the Median Nerve with Chronic Tenosynovitis of the Flexor Tendons.  Fingertip Is Able to Be Introduced Proximally and the Distal Forearm Fascia Was Split on the Ulnar Aspect of the Nerve with Direct Visualization using loupe magnification.  Palmar fat vascular arch was identified distally and fingertip could be passed in both areas with no areas of median nerve compression.  Tourniquet deflated hemostasis with bipolar.  3-0 nylon skin closure postop dressing with Xeroform 4 x 4's Webril 20 years and Ace wrap was applied.  Outpatient surgery is appropriate treatment this condition also  follow-up 1 week.

## 2021-12-02 NOTE — H&P (Signed)
Assessment & Plan: Visit Diagnoses:  1. Neck pain   2. Bilateral carpal tunnel syndrome       Plan: Patient is failed anti-inflammatories splinting persistent symptoms greater than 5 years with positive nerve conduction velocities for carpal tunnel syndrome right greater than left.  She like to proceed with right carpal tunnel release which would be done under some MAC with local.  Procedure discussed.  Questions elicited and answered risks were discussed she requests we proceed.   Follow-Up Instructions: No follow-ups on file.    Orders:     Orders Placed This Encounter  Procedures   XR Cervical Spine 2 or 3 views    No orders of the defined types were placed in this encounter.        Procedures: No procedures performed     Clinical Data: No additional findings.     Subjective:    Chief Complaint  Patient presents with   Right Hand - Numbness   Left Hand - Numbness      HPI 46 year old female had previous cervical fusion by me October 2018 C4-C6.  Prior to that she had electrical test which did not show carpal tunnel syndrome right greater than left and her neck surgery was for cervical stenosis.  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  She has been using Tylenol arthritis but has persistent pain in her hip and some limping with increased activities.  She is in a sling splint sent on her wrist at night.  She states electrical test for carpal tunnel were very painful and she is here now wants to proceed with carpal tunnel release.  Last nerve conductions were greater than 5 years ago she has had persistent pain wakes her up at night she has to shake her hands she has been wearing splints drops objects during the day.  No symptoms on ulnar one half fingers right or left.  Right hand symptoms are worse than left.   Review of Systems positive for treated hypertension.  Previous cervical  fusion C4-C6.  Bilateral carpal tunnel syndrome.     Objective: Vital Signs: Ht _0  (1.626 m)   Wt 187 lb (84.8 kg)   BMI 32.10 kg/m    Physical Exam Constitutional:      Appearance: She is well-developed.  HENT:     Head: Normocephalic.     Right Ear: External ear normal.     Left Ear: External ear normal. There is no impacted cerumen.  Eyes:     Pupils: Pupils are equal, round, and reactive to light.  Neck:     Thyroid: No thyromegaly.     Trachea: No tracheal deviation.  Cardiovascular:     Rate and Rhythm: Normal rate.  Pulmonary:     Effort: Pulmonary effort is normal.  Abdominal:     Palpations: Abdomen is soft.  Musculoskeletal:     Cervical back: No rigidity.  Skin:    General: Skin is warm and dry.  Neurological:     Mental Status: She is alert and oriented to person, place, and time.  Psychiatric:        Behavior: Behavior normal.      Ortho Exam patient has mild thenar atrophy worse on the right than left.  Decreased two-point radial 3 and half fingers both right and left.  Positive Phalen's and carpal compression test right greater than left.  Ulnar nerve at the elbow is normal exam no brachial plexus tenderness well-healed anterior cervical incision.   Specialty Comments:  No specialty comments available.   Imaging: XR Cervical Spine 2 or 3 views   Result Date: 11/07/2021 AP lateral cervical spine images are obtained and reviewed.  This shows solid fusion C4-5 C5-6 with plate in good allograft cooperation.  Adjacent C3-4 endplate irregularity disc base narrowing and anterior spurring with uncovertebral sclerosis.  Minimal spurring at C6-7. Impression: Solid two-level cervical fusion C4-C6 with adjacent C3-4 spondylosis.      PMFS History:     Patient Active Problem List    Diagnosis Date Noted   Bilateral carpal tunnel syndrome 11/07/2021   S/P cervical spinal fusion 05/14/2017   Cervical spinal stenosis 03/27/2017   Cervical stenosis of spine  02/17/2017   Other spondylosis with radiculopathy, cervical region 01/08/2017   Essential hypertension 05/08/2015   Neck pain, chronic 09/18/2014   Back pain 12/01/2013        Past Medical History:  Diagnosis Date   Arthritis     GERD (gastroesophageal reflux disease)     H/O hiatal hernia     Headache      migranes   Hypertension           Family History  Problem Relation Age of Onset   Heart disease Mother          MI         Past Surgical History:  Procedure Laterality Date   ANTERIOR CERVICAL DECOMP/DISCECTOMY FUSION N/A 03/27/2017    Procedure: C4-5, C5-6 ANTERIOR CERVICAL DISCECTOMY AND FUSION, ALLOGRAFT, PLATE;  Surgeon: Marybelle Killings, MD;  Location: [facility];  Service: Orthopedics;  Laterality: N/A;   BACK SURGERY       CESAREAN SECTION       LUMBAR LAMINECTOMY/DECOMPRESSION MICRODISCECTOMY Right 12/21/2012    Procedure: LUMBAR LAMINECTOMY/DECOMPRESSION MICRODISCECTOMY 2 level;  Surgeon: Floyce Stakes, MD;  Location: [facility];  Service: Neurosurgery;  Laterality: Right;  Right Lumbar three-four,Lumbar four-five Microdiskectomy   SPINE SURGERY       TUBAL LIGATION       WISDOM TOOTH EXTRACTION        Social History         Occupational History   Not on file  Tobacco Use   Smoking status: Every Day      Packs/day: 0.50      Years: 10.00      Pack years: 5.00      Types: Cigarettes   Smokeless tobacco: Never  Vaping Use   Vaping Use: Never used  Substance and Sexual Activity   Alcohol use: No   Drug use: Not Currently      Types: Marijuana      Comment: last 03/23/2017-uses for pain Quit yrs ago   Sexual activity: Not on file

## 2021-12-02 NOTE — Interval H&P Note (Signed)
History and Physical Interval Note:  12/02/2021 11:05 AM  Toni Kim  has presented today for surgery, with the diagnosis of right carpal tunnel syndrome.  The various methods of treatment have been discussed with the patient and family. After consideration of risks, benefits and other options for treatment, the patient has consented to  Procedure(s): RIGHT CARPAL TUNNEL RELEASE (Right) as a surgical intervention.  The patient's history has been reviewed, patient examined, no change in status, stable for surgery.  I have reviewed the patient's chart and labs.  Questions were answered to the patient's satisfaction.     Eldred Manges

## 2021-12-02 NOTE — Anesthesia Postprocedure Evaluation (Signed)
Anesthesia Post Note  Patient: Toni Kim  Procedure(s) Performed: RIGHT CARPAL TUNNEL RELEASE (Right: Hand)     Patient location during evaluation: PACU Anesthesia Type: MAC Level of consciousness: awake and alert and oriented Pain management: pain level controlled Vital Signs Assessment: post-procedure vital signs reviewed and stable Respiratory status: spontaneous breathing, nonlabored ventilation and respiratory function stable Cardiovascular status: stable and blood pressure returned to baseline Postop Assessment: no apparent nausea or vomiting Anesthetic complications: no   No notable events documented.  Last Vitals:  Vitals:   12/02/21 0934 12/02/21 1145  BP: (!) 146/96 (!) 154/97  Pulse: 76 75  Resp: 16 16  Temp: (!) 36.4 C 36.5 C  SpO2: 100% 92%    Last Pain:  Vitals:   12/02/21 1145  TempSrc:   PainSc: 0-No pain                 Daevon Holdren A.

## 2021-12-02 NOTE — Discharge Instructions (Addendum)
Pain medication Norco was sent into your pharmacy in [place].  Keep hand elevated above heart.  You can use ice over the palm of your hand off-and-on for a few days to also help with the pain.  No pulling lifting grabbing activities with the right hand.  If you keep your hand elevated above your heart pain will be decreased by 50%.  See Dr. Ophelia Charter in about 1 week.  You can call the office if you do not have an appointment.  If there is a problem getting your pain medication you can call our office if it needs to be preauthorized through  your insurance.  Keep your dressing on.  You can cover the arm with the bag when you take a shower.  When you come back to the office in about a week we will change her dressing    Post Anesthesia Home Care Instructions  Activity: Get plenty of rest for the remainder of the day. A responsible individual must stay with you for 24 hours following the procedure.  For the next 24 hours, DO NOT: -Drive a car -Advertising copywriter -Drink alcoholic beverages -Take any medication unless instructed by your physician -Make any legal decisions or sign important papers.  Meals: Start with liquid foods such as gelatin or soup. Progress to regular foods as tolerated. Avoid greasy, spicy, heavy foods. If nausea and/or vomiting occur, drink only clear liquids until the nausea and/or vomiting subsides. Call your physician if vomiting continues.  Special Instructions/Symptoms: Your throat may feel dry or sore from the anesthesia or the breathing tube placed in your throat during surgery. If this causes discomfort, gargle with warm salt water. The discomfort should disappear within 24 hours.  If you had a scopolamine patch placed behind your ear for the management of post- operative nausea and/or vomiting:  1. The medication in the patch is effective for 72 hours, after which it should be removed.  Wrap patch in a tissue and discard in the trash. Wash hands thoroughly with soap  and water. 2. You may remove the patch earlier than 72 hours if you experience unpleasant side effects which may include dry mouth, dizziness or visual disturbances. 3. Avoid touching the patch. Wash your hands with soap and water after contact with the patch.

## 2021-12-02 NOTE — Transfer of Care (Signed)
Immediate Anesthesia Transfer of Care Note  Patient: Toni Kim  Procedure(s) Performed: RIGHT CARPAL TUNNEL RELEASE (Right: Hand)  Patient Location: PACU  Anesthesia Type:MAC  Level of Consciousness: awake, alert  and oriented  Airway & Oxygen Therapy: Patient Spontanous Breathing and Patient connected to face mask oxygen  Post-op Assessment: Report given to RN and Post -op Vital signs reviewed and stable  Post vital signs: Reviewed and stable  Last Vitals:  Vitals Value Taken Time  BP    Temp    Pulse 75 12/02/21 1144  Resp    SpO2 92 % 12/02/21 1144  Vitals shown include unvalidated device data.  Last Pain:  Vitals:   12/02/21 0934  TempSrc: Oral  PainSc: 10-Worst pain ever      Patients Stated Pain Goal: 10 (75/30/10 4045)  Complications: No notable events documented.

## 2021-12-02 NOTE — Anesthesia Preprocedure Evaluation (Addendum)
Anesthesia Evaluation  Patient identified by MRN, date of birth, ID band Patient awake    Reviewed: Allergy & Precautions, NPO status , Patient's Chart, lab work & pertinent test results, reviewed documented beta blocker date and time   Airway Mallampati: II  TM Distance: >3 FB Neck ROM: Full    Dental no notable dental hx. (+) Teeth Intact, Dental Advisory Given   Pulmonary Current Smoker,    Pulmonary exam normal breath sounds clear to auscultation       Cardiovascular hypertension, Pt. on medications Normal cardiovascular exam Rhythm:Regular Rate:Normal     Neuro/Psych  Headaches, Right Carpal tunnel syndrome  Neuromuscular disease negative psych ROS   GI/Hepatic Neg liver ROS, hiatal hernia, GERD  Medicated and Controlled,  Endo/Other  Obesity  Renal/GU negative Renal ROS  negative genitourinary   Musculoskeletal  (+) Arthritis , Osteoarthritis,  Low back pain   Abdominal (+) + obese,   Peds  Hematology negative hematology ROS (+)   Anesthesia Other Findings   Reproductive/Obstetrics                            Anesthesia Physical Anesthesia Plan  ASA: 2  Anesthesia Plan: MAC   Post-op Pain Management: Minimal or no pain anticipated   Induction: Intravenous  PONV Risk Score and Plan: 2 and Treatment may vary due to age or medical condition, Ondansetron and Propofol infusion  Airway Management Planned: Natural Airway, Simple Face Mask and Nasal Cannula  Additional Equipment: None  Intra-op Plan:   Post-operative Plan:   Informed Consent: I have reviewed the patients History and Physical, chart, labs and discussed the procedure including the risks, benefits and alternatives for the proposed anesthesia with the patient or authorized representative who has indicated his/her understanding and acceptance.     Dental advisory given  Plan Discussed with: CRNA and  Anesthesiologist  Anesthesia Plan Comments:        Anesthesia Quick Evaluation

## 2021-12-03 ENCOUNTER — Encounter (HOSPITAL_BASED_OUTPATIENT_CLINIC_OR_DEPARTMENT_OTHER): Payer: Self-pay | Admitting: Orthopaedic Surgery

## 2021-12-12 ENCOUNTER — Ambulatory Visit: Payer: Medicaid Other | Admitting: Orthopaedic Surgery

## 2021-12-12 DIAGNOSIS — G5601 Carpal tunnel syndrome, right upper limb: Secondary | ICD-10-CM

## 2021-12-12 NOTE — Progress Notes (Signed)
   Post-Op Visit Note   Patient: Toni Kim           Date of Birth: [birth year]           MRN: [id number] Visit Date: 12/12/2021 PCP: Junie Spencer, FNP   Assessment & Plan: Post right carpal tunnel release states she still has some numbness still has some problems with her left shoulder some discomfort in her neck where she had two-level cervical fusion 5 years ago by me.  She has some spondylosis above the fusion at C3-4 and some mild spurring at C6-7 below the fusion.  Carpal tunnel incision looks good Browner splint applied stockinettes to cover the sutures.  Return 1 week for suture removal  Chief Complaint:  Chief Complaint  Patient presents with   Right Hand - Routine Post Op    Rt CTR DOS 12/02/21   Visit Diagnoses:  1. Carpal tunnel syndrome, right upper limb     Plan: Return 1 week for suture removal right carpal tunnel release.  Follow-Up Instructions: No follow-ups on file.   Orders:  No orders of the defined types were placed in this encounter.  No orders of the defined types were placed in this encounter.   Imaging: No results found.  PMFS History: Patient Active Problem List   Diagnosis Date Noted   Carpal tunnel syndrome, right upper limb 11/07/2021   S/P cervical spinal fusion 05/14/2017   Cervical spinal stenosis 03/27/2017   Cervical stenosis of spine 02/17/2017   Other spondylosis with radiculopathy, cervical region 01/08/2017   Essential hypertension 05/08/2015   Neck pain, chronic 09/18/2014   Back pain 12/01/2013   Past Medical History:  Diagnosis Date   Arthritis    GERD (gastroesophageal reflux disease)    H/O hiatal hernia    Headache    migranes   Hypertension     Family History  Problem Relation Age of Onset   Heart disease Mother        MI    Past Surgical History:  Procedure Laterality Date   ANTERIOR CERVICAL DECOMP/DISCECTOMY FUSION N/A 03/27/2017   Procedure: C4-5, C5-6 ANTERIOR CERVICAL DISCECTOMY AND FUSION,  ALLOGRAFT, PLATE;  Surgeon: Eldred Manges, MD;  Location: [facility];  Service: Orthopedics;  Laterality: N/A;   BACK SURGERY     CARPAL TUNNEL RELEASE Right 12/02/2021   Procedure: RIGHT CARPAL TUNNEL RELEASE;  Surgeon: Eldred Manges, MD;  [facility];  Service: Orthopedics;  Laterality: Right;   CESAREAN SECTION     LUMBAR LAMINECTOMY/DECOMPRESSION MICRODISCECTOMY Right 12/21/2012   Procedure: LUMBAR LAMINECTOMY/DECOMPRESSION MICRODISCECTOMY 2 level;  Surgeon: Karn Cassis, MD;  Location: [facility];  Service: Neurosurgery;  Laterality: Right;  Right Lumbar three-four,Lumbar four-five Microdiskectomy   SPINE SURGERY     TUBAL LIGATION     WISDOM TOOTH EXTRACTION     Social History   Occupational History   Not on file  Tobacco Use   Smoking status: Every Day    Packs/day: 0.50    Years: 10.00    Total pack years: 5.00    Types: Cigarettes   Smokeless tobacco: Never  Vaping Use   Vaping Use: Never used  Substance and Sexual Activity   Alcohol use: No   Drug use: Not Currently    Types: Marijuana    Comment: last 03/23/2017-uses for pain Quit yrs ago   Sexual activity: Not on file

## 2021-12-19 ENCOUNTER — Encounter: Payer: Medicaid Other | Admitting: Orthopaedic Surgery

## 2021-12-23 ENCOUNTER — Encounter: Payer: Self-pay | Admitting: Nurse Practitioner

## 2021-12-23 ENCOUNTER — Ambulatory Visit: Payer: Medicaid Other | Admitting: Nurse Practitioner

## 2021-12-23 VITALS — BP 125/87 | HR 81 | Temp 98.6°F | Ht 65.0 in | Wt 190.2 lb

## 2021-12-23 DIAGNOSIS — R110 Nausea: Secondary | ICD-10-CM | POA: Diagnosis not present

## 2021-12-23 DIAGNOSIS — N920 Excessive and frequent menstruation with regular cycle: Secondary | ICD-10-CM

## 2021-12-23 MED ORDER — NAPROXEN 500 MG PO TABS: 500.0000 mg | ORAL_TABLET | Freq: Two times a day (BID) | ORAL | 0 refills | Status: DC

## 2021-12-23 MED ORDER — ONDANSETRON HCL 4 MG PO TABS: 4.0000 mg | ORAL_TABLET | Freq: Three times a day (TID) | ORAL | 0 refills | Status: DC | PRN

## 2021-12-23 NOTE — Progress Notes (Signed)
Acute Office Visit  Subjective:     Patient ID: Toni Kim, female    DOB: 10-19-1975, 46 y.o.   MRN: [id number]  Chief Complaint  Patient presents with   Menstrual Problem    Has had one since 06/18 - states it has been very heavy , clots - bad cramps    HPI Patient is in today for patient presents with menorrhagia symptoms present for a month.  This is not an acute problem.  Patient is reporting heavy.'s with clotting pain and nausea.  No fever associated with current symptoms.  Review of Systems  Constitutional: Negative.   HENT: Negative.    Eyes: Negative.   Respiratory: Negative.    Cardiovascular: Negative.   Gastrointestinal:  Positive for abdominal pain and nausea.  Genitourinary: Negative.   Musculoskeletal:  Positive for back pain.  Skin: Negative.  Negative for rash.        Objective:    BP 125/87   Pulse 81   Temp 98.6 F (37 C)   Ht 5\' 5"  (1.651 m)   Wt 190 lb 3.2 oz (86.3 kg)   LMP 12/01/2021   SpO2 98%   BMI 31.65 kg/m    Physical Exam Vitals and nursing note reviewed.  Constitutional:      Appearance: Normal appearance. She is obese.  HENT:     Head: Normocephalic.     Right Ear: External ear normal.     Left Ear: External ear normal.  Eyes:     Conjunctiva/sclera: Conjunctivae normal.  Cardiovascular:     Rate and Rhythm: Normal rate and regular rhythm.     Pulses: Normal pulses.     Heart sounds: Normal heart sounds.  Pulmonary:     Effort: Pulmonary effort is normal.     Breath sounds: Normal breath sounds.  Abdominal:     General: Bowel sounds are normal. There is no distension.     Tenderness: There is abdominal tenderness. There is no right CVA tenderness or left CVA tenderness.  Skin:    General: Skin is warm.     Findings: No rash.  Neurological:     General: No focal deficit present.     Mental Status: She is alert and oriented to person, place, and time.  Psychiatric:        Behavior: Behavior normal.     No  results found for any visits on 12/23/21.      Assessment & Plan:  Patient presents with signs and symptoms of menorrhagia with mild lower abdominal pain and nausea in the past 2 to 3 weeks.  Referral to OB/GYN completed.  Patient is not qualified to be on oral birth control pill because she is over 62 and a smoker.  Patient does not want a Depo or the Mirena as she had tried those and failed in the past.  Tylenol/ibuprofen for pain as tolerated.  Zofran 4 mg tablet by mouth for nausea.  Increase hydration, exercise and weight loss.  Follow-up with worsening unresolved symptoms.  Education provided to patient printed handouts given. Problem List Items Addressed This Visit   None Visit Diagnoses     Menorrhagia with regular cycle    -  Primary   Relevant Medications   naproxen (NAPROSYN) 500 MG tablet   Other Relevant Orders   Ambulatory referral to Gynecology   Nausea       Relevant Medications   ondansetron (ZOFRAN) 4 MG tablet       Meds ordered  this encounter  Medications   naproxen (NAPROSYN) 500 MG tablet    Sig: Take 1 tablet (500 mg total) by mouth 2 (two) times daily with a meal.    Dispense:  30 tablet    Refill:  0    Order Specific Question:   Supervising Provider    Answer:   Mechele Claude [[id number]]   ondansetron (ZOFRAN) 4 MG tablet    Sig: Take 1 tablet (4 mg total) by mouth every 8 (eight) hours as needed for nausea or vomiting.    Dispense:  20 tablet    Refill:  0    Order Specific Question:   Supervising Provider    Answer:   Mechele Claude [[id number]]    Return if symptoms worsen or fail to improve.  Daryll Drown, NP

## 2021-12-23 NOTE — Patient Instructions (Signed)
Menorrhagia Menorrhagia is when your monthly periods are heavy or last longer than normal. If you have this condition, bleeding and cramping may make it hard for you to do your daily activities. What are the causes? Common causes of this condition include: Growths in the womb (uterus). These are polyps or fibroids. These growths are not cancer. Problems with two hormones called estrogen and progesterone. One of the ovaries not releasing an egg during one or more months. A problem with the thyroid gland. Having a device for birth control (IUD). Side effects of some medicines, such as NSAIDs or blood thinners. A disorder that stops the blood from clotting normally. What increases the risk? You are more likely to have this condition if you have cancer of the womb. What are the signs or symptoms? Having to change your pad or tampon every 1-2 hours because it is soaked. Needing to use pads and tampons at the same time because of heavy bleeding. Needing to wake up to change your pads or tampons during the night. Passing blood clots larger than 1 inch (2.5 cm) in size. Having bleeding that lasts for more than 7 days. Having symptoms of low iron levels (anemia), such as feeling tired or having shortness of breath. How is this treated? You may not need to be treated for this condition. But if you need treatment, you may be given medicines: To reduce bleeding during your period. These include birth control medicines. To make your blood thick. This slows bleeding. To reduce swelling. Medicines that do this include ibuprofen. That have a hormone called progestin. That make the ovaries stop working for a short time. To treat low iron levels. You will be given iron pills if you have this condition. If medicines do not work, surgery may be done. Surgery may be done to: Remove a part of the lining of the womb. This lining is called the endometrium. This reduces bleeding during a period. Remove growths  in the womb. These may be polyps or fibroids. Remove the entire lining of the womb. Remove the womb entirely. This procedure is called a hysterectomy. Follow these instructions at home: Medicines Take over-the-counter and prescription medicines only as told by your doctor. This includes iron pills. Do not change or switch medicines without asking your doctor. Do not take aspirin or medicines that contain aspirin 1 week before or during your period. Aspirin may make bleeding worse. Managing constipation Iron pills may cause trouble pooping (constipation). To prevent or treat problems when pooping, you may need to: Drink enough fluid to keep your pee (urine) pale yellow. Take over-the-counter or prescription medicines. Eat foods that are high in fiber. These include beans, whole grains, and fresh fruits and vegetables. Limit foods that are high in fat and sugar. These include fried or sweet foods. General instructions If you need to change your pad or tampon more than once every 2 hours, limit your activity until the bleeding stops. Eat healthy meals and foods that are high in iron. Foods that have a lot of iron include: Leafy green vegetables. Meat. Liver. Eggs. Whole-grain breads and cereals. Do not try to lose weight until your heavy bleeding has stopped and you have normal amounts of iron in your blood. If you need to lose weight, work with your doctor. Keep all follow-up visits. Contact a doctor if: You soak through a pad or tampon every 1 or 2 hours, and this happens every time you have a period. You need to use pads and   tampons at the same time because you are bleeding so much. You are taking medicine, and: You feel like you may vomit. You vomit. You have watery poop (diarrhea). You have other problems that may be related to the medicine you are taking. Get help right away if: You soak through more than a pad or tampon in 1 hour. You pass clots bigger than 1 inch (2.5 cm)  wide. You feel short of breath. You feel like your heart is beating too fast. You feel dizzy or you faint. You feel very weak or tired. Summary Menorrhagia is when your menstrual periods are heavy or last longer than normal. You may not need to be treated for this condition. If you need treatment, you may be given medicines or have surgery. Take over-the-counter and prescription medicines only as told by your doctor. This includes iron pills. Get help right away if you soak through more than a pad or tampon in 1 hour or you pass large clots. Also, get help right away if you feel dizzy, short of breath, or very weak or tired. This information is not intended to replace advice given to you by your health care provider. Make sure you discuss any questions you have with your health care provider. Document Revised: 02/14/2020 Document Reviewed: 02/14/2020 Elsevier Patient Education  2023 Elsevier Inc.  

## 2021-12-26 ENCOUNTER — Ambulatory Visit (INDEPENDENT_AMBULATORY_CARE_PROVIDER_SITE_OTHER): Payer: Medicaid Other | Admitting: Orthopaedic Surgery

## 2021-12-26 DIAGNOSIS — G5601 Carpal tunnel syndrome, right upper limb: Secondary | ICD-10-CM

## 2021-12-26 NOTE — Progress Notes (Signed)
Post-Op Visit Note   Patient: Toni Kim           Date of Birth: Aug 27, 1975           MRN: [id number] Visit Date: 12/26/2021 PCP: Junie Spencer, FNP   Assessment & Plan: Patient returns 2 weeks post carpal tunnel release.  She had some serosanguineous drainage midportion of the incision that concerned her.  She does not have any cellulitis sutures are intact there is no drainage at this point.  Sutures are harvested.  She continues to have severe problems with carpal tunnel syndrome on the opposite left hand which also had positive electrical test.  Check her next week.  If she runs a fever or develop purulent drainage she will call and I can send some antibiotics in to her pharmacy.  Recheck 1 week.  Chief Complaint:  Chief Complaint  Patient presents with   Right Wrist - Follow-up    12/02/21 CTR   Visit Diagnoses:  1. Carpal tunnel syndrome, right upper limb     Plan: Return 1 week for injection.  Follow-Up Instructions: Return in about 1 week (around 01/02/2022).   Orders:  No orders of the defined types were placed in this encounter.  No orders of the defined types were placed in this encounter.   Imaging: No results found.  PMFS History: Patient Active Problem List   Diagnosis Date Noted   Carpal tunnel syndrome, right upper limb 11/07/2021   S/P cervical spinal fusion 05/14/2017   Cervical spinal stenosis 03/27/2017   Cervical stenosis of spine 02/17/2017   Other spondylosis with radiculopathy, cervical region 01/08/2017   Essential hypertension 05/08/2015   Neck pain, chronic 09/18/2014   Back pain 12/01/2013   Past Medical History:  Diagnosis Date   Arthritis    GERD (gastroesophageal reflux disease)    H/O hiatal hernia    Headache    migranes   Hypertension     Family History  Problem Relation Age of Onset   Heart disease Mother        MI    Past Surgical History:  Procedure Laterality Date   ANTERIOR CERVICAL DECOMP/DISCECTOMY FUSION  N/A 03/27/2017   Procedure: C4-5, C5-6 ANTERIOR CERVICAL DISCECTOMY AND FUSION, ALLOGRAFT, PLATE;  Surgeon: Eldred Manges, MD;  Location: [facility];  Service: Orthopedics;  Laterality: N/A;   BACK SURGERY     CARPAL TUNNEL RELEASE Right 12/02/2021   Procedure: RIGHT CARPAL TUNNEL RELEASE;  Surgeon: Eldred Manges, MD;  Location: [facility];  Service: Orthopedics;  Laterality: Right;   CESAREAN SECTION     LUMBAR LAMINECTOMY/DECOMPRESSION MICRODISCECTOMY Right 12/21/2012   Procedure: LUMBAR LAMINECTOMY/DECOMPRESSION MICRODISCECTOMY 2 level;  Surgeon: Karn Cassis, MD;  Location: [facility];  Service: Neurosurgery;  Laterality: Right;  Right Lumbar three-four,Lumbar four-five Microdiskectomy   SPINE SURGERY     TUBAL LIGATION     WISDOM TOOTH EXTRACTION     Social History   Occupational History   Not on file  Tobacco Use   Smoking status: Every Day    Packs/day: 0.50    Years: 10.00    Total pack years: 5.00    Types: Cigarettes   Smokeless tobacco: Never  Vaping Use   Vaping Use: Never used  Substance and Sexual Activity   Alcohol use: No   Drug use: Not Currently    Types: Marijuana    Comment: last 03/23/2017-uses for pain Quit yrs ago   Sexual activity: Not on  file

## 2022-01-02 ENCOUNTER — Encounter: Payer: Medicaid Other | Admitting: Orthopaedic Surgery

## 2022-01-09 ENCOUNTER — Encounter: Payer: Medicaid Other | Admitting: Orthopaedic Surgery

## 2022-01-16 ENCOUNTER — Ambulatory Visit (INDEPENDENT_AMBULATORY_CARE_PROVIDER_SITE_OTHER): Payer: Medicaid Other | Admitting: Orthopaedic Surgery

## 2022-01-16 DIAGNOSIS — G5601 Carpal tunnel syndrome, right upper limb: Secondary | ICD-10-CM

## 2022-01-16 NOTE — Progress Notes (Signed)
Follow-up right carpal tunnel release.  She will add some lotion the incision is dry.  No drainage.  Opposite left hand also has median nerve compression and she wants to wait and let the right hand get a little bit better before she schedules the left carpal tunnel release.  She will return in 3 weeks for recheck.

## 2022-02-06 ENCOUNTER — Encounter: Payer: Self-pay | Admitting: Orthopaedic Surgery

## 2022-02-06 ENCOUNTER — Ambulatory Visit (INDEPENDENT_AMBULATORY_CARE_PROVIDER_SITE_OTHER): Payer: Medicaid Other | Admitting: Orthopaedic Surgery

## 2022-02-06 VITALS — Ht 65.0 in | Wt 190.0 lb

## 2022-02-06 DIAGNOSIS — G5601 Carpal tunnel syndrome, right upper limb: Secondary | ICD-10-CM

## 2022-02-06 NOTE — Progress Notes (Signed)
   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  She like to have her opposite left hand done first week in October.  We will call her about scheduling.  Right hand is healed she still has some tenderness  Chief Complaint:  Chief Complaint  Patient presents with   Right Hand - Follow-up    12/02/2021 Right CTR   Visit Diagnoses: bilat CTS post right CTR  Plan: We will proceed with scheduling carpal tunnel release on the left first week of October at her request.  Follow-Up Instructions: No follow-ups on file.   Orders:  No orders of the defined types were placed in this encounter.  No orders of the defined types were placed in this encounter.   Imaging: No results found.  PMFS History: Patient Active Problem List   Diagnosis Date Noted   Carpal tunnel syndrome, right upper limb 11/07/2021   S/P cervical spinal fusion 05/14/2017   Cervical spinal stenosis 03/27/2017   Cervical stenosis of spine 02/17/2017   Other spondylosis with radiculopathy, cervical region 01/08/2017   Essential hypertension 05/08/2015   Neck pain, chronic 09/18/2014   Back pain 12/01/2013   Past Medical History:  Diagnosis Date   Arthritis    GERD (gastroesophageal reflux disease)    H/O hiatal hernia    Headache    migranes   Hypertension     Family History  Problem Relation Age of Onset   Heart disease Mother        MI    Past Surgical History:  Procedure Laterality Date   ANTERIOR CERVICAL DECOMP/DISCECTOMY FUSION N/A 03/27/2017   Procedure: C4-5, C5-6 ANTERIOR CERVICAL DISCECTOMY AND FUSION, ALLOGRAFT, PLATE;  Surgeon: Eldred Manges, MD;  Location: [facility];  Service: Orthopedics;  Laterality: N/A;   BACK SURGERY      CARPAL TUNNEL RELEASE Right 12/02/2021   Procedure: RIGHT CARPAL TUNNEL RELEASE;  Surgeon: Eldred Manges, MD;  Location: [facility];  Service: Orthopedics;  Laterality: Right;   CESAREAN SECTION     LUMBAR LAMINECTOMY/DECOMPRESSION MICRODISCECTOMY Right 12/21/2012   Procedure: LUMBAR LAMINECTOMY/DECOMPRESSION MICRODISCECTOMY 2 level;  Surgeon: Karn Cassis, MD;  Location: [facility];  Service: Neurosurgery;  Laterality: Right;  Right Lumbar three-four,Lumbar four-five Microdiskectomy   SPINE SURGERY     TUBAL LIGATION     WISDOM TOOTH EXTRACTION     Social History   Occupational History   Not on file  Tobacco Use   Smoking status: Every Day    Packs/day: 0.50    Years: 10.00    Total pack years: 5.00    Types: Cigarettes   Smokeless tobacco: Never  Vaping Use   Vaping Use: Never used  Substance and Sexual Activity   Alcohol use: No   Drug use: Not Currently    Types: Marijuana    Comment: last 03/23/2017-uses for pain Quit yrs ago   Sexual activity: Not on file

## 2022-02-19 ENCOUNTER — Ambulatory Visit: Payer: Medicaid Other | Admitting: Family Medicine

## 2022-02-20 ENCOUNTER — Encounter: Payer: Self-pay | Admitting: Family

## 2022-02-27 ENCOUNTER — Ambulatory Visit: Payer: Medicaid Other | Admitting: Orthopaedic Surgery

## 2022-03-05 ENCOUNTER — Encounter: Payer: Medicaid Other | Admitting: Family Medicine

## 2022-03-21 ENCOUNTER — Other Ambulatory Visit: Payer: Self-pay

## 2022-03-21 ENCOUNTER — Encounter (HOSPITAL_BASED_OUTPATIENT_CLINIC_OR_DEPARTMENT_OTHER): Payer: Self-pay | Admitting: Orthopaedic Surgery

## 2022-03-24 ENCOUNTER — Ambulatory Visit (HOSPITAL_BASED_OUTPATIENT_CLINIC_OR_DEPARTMENT_OTHER): Payer: Medicaid Other | Admitting: Anesthesiology

## 2022-03-24 ENCOUNTER — Encounter (HOSPITAL_BASED_OUTPATIENT_CLINIC_OR_DEPARTMENT_OTHER): Payer: Self-pay | Admitting: Orthopaedic Surgery

## 2022-03-24 ENCOUNTER — Other Ambulatory Visit: Payer: Self-pay

## 2022-03-24 ENCOUNTER — Ambulatory Visit (HOSPITAL_BASED_OUTPATIENT_CLINIC_OR_DEPARTMENT_OTHER)
Admission: RE | Admit: 2022-03-24 | Discharge: 2022-03-24 | Disposition: A | Discharge status: Home / Self Care | Payer: Medicaid Other | Source: Ambulatory Visit | Attending: Orthopaedic Surgery | Admitting: Orthopaedic Surgery

## 2022-03-24 ENCOUNTER — Encounter (HOSPITAL_BASED_OUTPATIENT_CLINIC_OR_DEPARTMENT_OTHER)
Admission: RE | Disposition: A | Discharge status: Home / Self Care | Payer: Self-pay | Source: Ambulatory Visit | Attending: Orthopaedic Surgery

## 2022-03-24 DIAGNOSIS — F17210 Nicotine dependence, cigarettes, uncomplicated: Secondary | ICD-10-CM

## 2022-03-24 DIAGNOSIS — K219 Gastro-esophageal reflux disease without esophagitis: Secondary | ICD-10-CM | POA: Insufficient documentation

## 2022-03-24 DIAGNOSIS — M1990 Unspecified osteoarthritis, unspecified site: Secondary | ICD-10-CM | POA: Diagnosis not present

## 2022-03-24 DIAGNOSIS — Z6833 Body mass index (BMI) 33.0-33.9, adult: Secondary | ICD-10-CM | POA: Diagnosis not present

## 2022-03-24 DIAGNOSIS — G5602 Carpal tunnel syndrome, left upper limb: Secondary | ICD-10-CM | POA: Diagnosis not present

## 2022-03-24 DIAGNOSIS — Z79899 Other long term (current) drug therapy: Secondary | ICD-10-CM | POA: Diagnosis not present

## 2022-03-24 DIAGNOSIS — E669 Obesity, unspecified: Secondary | ICD-10-CM | POA: Diagnosis not present

## 2022-03-24 DIAGNOSIS — I10 Essential (primary) hypertension: Secondary | ICD-10-CM

## 2022-03-24 DIAGNOSIS — Z981 Arthrodesis status: Secondary | ICD-10-CM | POA: Insufficient documentation

## 2022-03-24 DIAGNOSIS — Z01818 Encounter for other preprocedural examination: Secondary | ICD-10-CM

## 2022-03-24 HISTORY — PX: CARPAL TUNNEL RELEASE: SHX101

## 2022-03-24 LAB — BASIC METABOLIC PANEL
Anion gap: 10 (ref 5–15)
BUN: 7 mg/dL (ref 6–20)
CO2: 23 mmol/L (ref 22–32)
Calcium: 9.3 mg/dL (ref 8.9–10.3)
Chloride: 105 mmol/L (ref 98–111)
Creatinine, Ser: 0.89 mg/dL (ref 0.44–1.00)
GFR, Estimated: >60 mL/min (ref >60)
Glucose, Bld: 79 mg/dL (ref 70–99)
Potassium: 3.7 mmol/L (ref 3.5–5.1)
Sodium: 138 mmol/L (ref 135–145)

## 2022-03-24 LAB — SURGICAL PCR SCREEN
MRSA, PCR: NEGATIVE
Staphylococcus aureus: NEGATIVE

## 2022-03-24 LAB — POCT PREGNANCY, URINE: Preg Test, Ur: NEGATIVE

## 2022-03-24 SURGERY — CARPAL TUNNEL RELEASE
Anesthesia: Monitor Anesthesia Care | Site: Wrist | Laterality: Left

## 2022-03-24 MED ORDER — OXYCODONE HCL 5 MG PO TABS
ORAL_TABLET | ORAL | Status: AC
  Filled 2022-03-24: qty 1

## 2022-03-24 MED ORDER — ONDANSETRON HCL 4 MG/2ML IJ SOLN
INTRAMUSCULAR | Status: AC
  Filled 2022-03-24: qty 2

## 2022-03-24 MED ORDER — POVIDONE-IODINE 10 % EX SWAB
2.0000 | Freq: Once | CUTANEOUS | Status: AC
  Administered 2022-03-24: 2 via TOPICAL

## 2022-03-24 MED ORDER — CHLORHEXIDINE GLUCONATE 4 % EX LIQD: 60.0000 mL | Freq: Once | CUTANEOUS | Status: DC

## 2022-03-24 MED ORDER — MIDAZOLAM HCL 2 MG/2ML IJ SOLN
INTRAMUSCULAR | Status: AC
  Filled 2022-03-24: qty 2

## 2022-03-24 MED ORDER — LIDOCAINE HCL (PF) 1 % IJ SOLN
INTRAMUSCULAR | Status: DC | PRN
  Administered 2022-03-24: 5 mL

## 2022-03-24 MED ORDER — HYDROMORPHONE HCL 1 MG/ML IJ SOLN: 0.2500 mg | INTRAMUSCULAR | Status: DC | PRN

## 2022-03-24 MED ORDER — 0.9 % SODIUM CHLORIDE (POUR BTL) OPTIME
TOPICAL | Status: DC | PRN
  Administered 2022-03-24: 50 mL

## 2022-03-24 MED ORDER — PROPOFOL 10 MG/ML IV BOLUS
INTRAVENOUS | Status: AC
  Filled 2022-03-24: qty 20

## 2022-03-24 MED ORDER — CEFAZOLIN SODIUM-DEXTROSE 2-4 GM/100ML-% IV SOLN
INTRAVENOUS | Status: AC
  Filled 2022-03-24: qty 100

## 2022-03-24 MED ORDER — BUPIVACAINE HCL (PF) 0.25 % IJ SOLN
INTRAMUSCULAR | Status: DC | PRN
  Administered 2022-03-24: 5 mL

## 2022-03-24 MED ORDER — ONDANSETRON HCL 4 MG/2ML IJ SOLN: 4.0000 mg | Freq: Once | INTRAMUSCULAR | Status: DC | PRN

## 2022-03-24 MED ORDER — KETOROLAC TROMETHAMINE 30 MG/ML IJ SOLN: 30.0000 mg | Freq: Once | INTRAMUSCULAR | Status: DC | PRN

## 2022-03-24 MED ORDER — OXYCODONE HCL 5 MG/5ML PO SOLN: 5.0000 mg | Freq: Once | ORAL | Status: AC | PRN

## 2022-03-24 MED ORDER — FENTANYL CITRATE (PF) 100 MCG/2ML IJ SOLN
INTRAMUSCULAR | Status: AC
  Filled 2022-03-24: qty 2

## 2022-03-24 MED ORDER — PROPOFOL 500 MG/50ML IV EMUL
INTRAVENOUS | Status: DC | PRN
  Administered 2022-03-24: 200 ug/kg/min via INTRAVENOUS

## 2022-03-24 MED ORDER — LIDOCAINE HCL (PF) 1 % IJ SOLN
INTRAMUSCULAR | Status: AC
  Filled 2022-03-24: qty 90

## 2022-03-24 MED ORDER — AMISULPRIDE (ANTIEMETIC) 5 MG/2ML IV SOLN: 10.0000 mg | Freq: Once | INTRAVENOUS | Status: DC | PRN

## 2022-03-24 MED ORDER — MEPERIDINE HCL 25 MG/ML IJ SOLN: 6.2500 mg | INTRAMUSCULAR | Status: DC | PRN

## 2022-03-24 MED ORDER — OXYCODONE HCL 5 MG PO TABS
5.0000 mg | ORAL_TABLET | Freq: Once | ORAL | Status: AC | PRN
  Administered 2022-03-24: 5 mg via ORAL

## 2022-03-24 MED ORDER — ONDANSETRON HCL 4 MG/2ML IJ SOLN
INTRAMUSCULAR | Status: DC | PRN
  Administered 2022-03-24: 4 mg via INTRAVENOUS

## 2022-03-24 MED ORDER — LACTATED RINGERS IV SOLN: INTRAVENOUS | Status: DC

## 2022-03-24 MED ORDER — CEFAZOLIN SODIUM-DEXTROSE 2-4 GM/100ML-% IV SOLN: 2.0000 g | INTRAVENOUS | Status: DC

## 2022-03-24 MED ORDER — HYDROCODONE-ACETAMINOPHEN 5-325 MG PO TABS: 1.0000 | ORAL_TABLET | Freq: Four times a day (QID) | ORAL | 0 refills | Status: DC | PRN

## 2022-03-24 MED ORDER — FENTANYL CITRATE (PF) 100 MCG/2ML IJ SOLN
INTRAMUSCULAR | Status: DC | PRN
  Administered 2022-03-24 (×2): 50 ug via INTRAVENOUS

## 2022-03-24 MED ORDER — MIDAZOLAM HCL 5 MG/5ML IJ SOLN
INTRAMUSCULAR | Status: DC | PRN
  Administered 2022-03-24: 2 mg via INTRAVENOUS

## 2022-03-24 MED ORDER — BUPIVACAINE HCL (PF) 0.25 % IJ SOLN
INTRAMUSCULAR | Status: AC
  Filled 2022-03-24: qty 90

## 2022-03-24 MED ORDER — ACETAMINOPHEN 500 MG PO TABS: 1000.0000 mg | ORAL_TABLET | Freq: Once | ORAL | Status: DC

## 2022-03-24 MED ORDER — LIDOCAINE 2% (20 MG/ML) 5 ML SYRINGE
INTRAMUSCULAR | Status: AC
  Filled 2022-03-24: qty 5

## 2022-03-24 SURGICAL SUPPLY — 37 items
APL SKNCLS STERI-STRIP NONHPOA (GAUZE/BANDAGES/DRESSINGS)
BENZOIN TINCTURE PRP APPL 2/3 (GAUZE/BANDAGES/DRESSINGS) IMPLANT
BLADE SURG 15 STRL LF DISP TIS (BLADE) ×1 IMPLANT
BLADE SURG 15 STRL SS (BLADE) ×1
BNDG CMPR 9X4 STRL LF SNTH (GAUZE/BANDAGES/DRESSINGS) ×1
BNDG ELASTIC 4X5.8 VLCR STR LF (GAUZE/BANDAGES/DRESSINGS) ×1 IMPLANT
BNDG ESMARK 4X9 LF (GAUZE/BANDAGES/DRESSINGS) IMPLANT
CORD BIPOLAR FORCEPS 12FT (ELECTRODE) ×1 IMPLANT
COVER BACK TABLE 60X90IN (DRAPES) ×1 IMPLANT
COVER MAYO STAND STRL (DRAPES) ×1 IMPLANT
CUFF TOURN SGL QUICK 18X4 (TOURNIQUET CUFF) ×1 IMPLANT
DRAPE EXTREMITY T 121X128X90 (DISPOSABLE) ×1 IMPLANT
DRAPE SURG 17X23 STRL (DRAPES) ×1 IMPLANT
DURAPREP 26ML APPLICATOR (WOUND CARE) ×1 IMPLANT
GAUZE SPONGE 4X4 12PLY STRL (GAUZE/BANDAGES/DRESSINGS) ×1 IMPLANT
GAUZE XEROFORM 1X8 LF (GAUZE/BANDAGES/DRESSINGS) ×1 IMPLANT
GLOVE BIO SURGEON STRL SZ7.5 (GLOVE) ×1 IMPLANT
GLOVE BIOGEL PI IND STRL 8 (GLOVE) ×1 IMPLANT
GOWN STRL REUS W/ TWL LRG LVL3 (GOWN DISPOSABLE) ×1 IMPLANT
GOWN STRL REUS W/ TWL XL LVL3 (GOWN DISPOSABLE) ×1 IMPLANT
GOWN STRL REUS W/TWL LRG LVL3 (GOWN DISPOSABLE) ×1
GOWN STRL REUS W/TWL XL LVL3 (GOWN DISPOSABLE) ×1
LOOP VESSEL MAXI BLUE (MISCELLANEOUS) IMPLANT
NDL HYPO 25X1 1.5 SAFETY (NEEDLE) IMPLANT
NEEDLE HYPO 25X1 1.5 SAFETY (NEEDLE) ×1 IMPLANT
NS IRRIG 1000ML POUR BTL (IV SOLUTION) ×1 IMPLANT
PACK BASIN DAY SURGERY FS (CUSTOM PROCEDURE TRAY) ×1 IMPLANT
PAD CAST 3X4 CTTN HI CHSV (CAST SUPPLIES) ×1 IMPLANT
PADDING CAST ABS COTTON 4X4 ST (CAST SUPPLIES) ×1 IMPLANT
PADDING CAST COTTON 3X4 STRL (CAST SUPPLIES) ×1
STOCKINETTE 4X48 STRL (DRAPES) ×1 IMPLANT
STRIP CLOSURE SKIN 1/2X4 (GAUZE/BANDAGES/DRESSINGS) IMPLANT
SUT ETHILON 3 0 PS 1 (SUTURE) ×1 IMPLANT
SYR BULB EAR ULCER 3OZ GRN STR (SYRINGE) ×1 IMPLANT
SYR CONTROL 10ML LL (SYRINGE) IMPLANT
TOWEL GREEN STERILE FF (TOWEL DISPOSABLE) ×1 IMPLANT
UNDERPAD 30X36 HEAVY ABSORB (UNDERPADS AND DIAPERS) ×1 IMPLANT

## 2022-03-24 NOTE — Anesthesia Postprocedure Evaluation (Signed)
Anesthesia Post Note  Patient: Toni Kim  Procedure(s) Performed: LEFT CARPAL TUNNEL RELEASE (Left: Wrist)     Patient location during evaluation: PACU Anesthesia Type: MAC Level of consciousness: awake and alert Pain management: pain level controlled Vital Signs Assessment: post-procedure vital signs reviewed and stable Respiratory status: spontaneous breathing, nonlabored ventilation and respiratory function stable Cardiovascular status: blood pressure returned to baseline and stable Postop Assessment: no apparent nausea or vomiting Anesthetic complications: no   No notable events documented.  Last Vitals:  Vitals:   03/24/22 1045 03/24/22 1059  BP: (!) 161/97   Pulse: 68 70  Resp: 16 18  Temp:  (!) 36.3 C  SpO2: 94% 97%    Last Pain:  Vitals:   03/24/22 1059  TempSrc:   PainSc: [place]

## 2022-03-24 NOTE — H&P (Signed)
Toni Kim is an 46 y.o. female.   Chief Complaint: Left CTS HPI: 46 year old female with bilateral carpal tunnel syndrome with previous release on the right hand doing well.  She had had previous cervical fusion 2018 which is solid.  Electrical test positive for carpal tunnel syndrome left hand.  Failed conservative treatment including splinting anti-inflammatories.  Past Medical History:  Diagnosis Date   Arthritis    GERD (gastroesophageal reflux disease)    H/O hiatal hernia    Headache    migranes   Hypertension     Past Surgical History:  Procedure Laterality Date   ANTERIOR CERVICAL DECOMP/DISCECTOMY FUSION N/A 03/27/2017   Procedure: C4-5, C5-6 ANTERIOR CERVICAL DISCECTOMY AND FUSION, ALLOGRAFT, PLATE;  Surgeon: Marybelle Killings, MD;  Location: [facility];  Service: Orthopedics;  Laterality: N/A;   BACK SURGERY     CARPAL TUNNEL RELEASE Right 12/02/2021   Procedure: RIGHT CARPAL TUNNEL RELEASE;  Surgeon: Marybelle Killings, MD;  Location: [facility];  Service: Orthopedics;  Laterality: Right;   CESAREAN SECTION     LUMBAR LAMINECTOMY/DECOMPRESSION MICRODISCECTOMY Right 12/21/2012   Procedure: LUMBAR LAMINECTOMY/DECOMPRESSION MICRODISCECTOMY 2 level;  Surgeon: Floyce Stakes, MD;  Location: [facility];  Service: Neurosurgery;  Laterality: Right;  Right Lumbar three-four,Lumbar four-five Microdiskectomy   SPINE SURGERY     TUBAL LIGATION     WISDOM TOOTH EXTRACTION      Family History  Problem Relation Age of Onset   Heart disease Mother        MI   Social History:  reports that she has been smoking cigarettes. She has a 5.00 pack-year smoking history. She has never used smokeless tobacco. She reports that she does not currently use drugs after having used the following drugs: Marijuana. She reports that she does not drink alcohol.  Allergies:  Allergies  Allergen Reactions   Codeine Hives    Medications Prior to Admission  Medication Sig Dispense Refill    hydrochlorothiazide (MICROZIDE) 12.5 MG capsule Take 1 capsule (12.5 mg total) by mouth daily. 90 capsule 1   naproxen (NAPROSYN) 500 MG tablet Take 1 tablet (500 mg total) by mouth 2 (two) times daily with a meal. 30 tablet 0   ondansetron (ZOFRAN) 4 MG tablet Take 1 tablet (4 mg total) by mouth every 8 (eight) hours as needed for nausea or vomiting. 20 tablet 0   HYDROcodone-acetaminophen (NORCO) 5-325 MG tablet Take 1-2 tablets by mouth every 6 (six) hours as needed for moderate pain. (Patient not taking: Reported on 02/06/2022) 30 tablet 0   ibuprofen (ADVIL) 600 MG tablet Take 1 tablet (600 mg total) by mouth every 8 (eight) hours as needed. (Patient not taking: Reported on 02/06/2022) 60 tablet 2    Results for orders placed or performed during the hospital encounter of 03/24/22 (from the past 48 hour(s))  Pregnancy, urine POC     Status: None   Collection Time: 03/24/22  7:22 AM  Result Value Ref Range   Preg Test, Ur NEGATIVE NEGATIVE    Comment:        THE SENSITIVITY OF THIS METHODOLOGY IS >24 mIU/mL   Basic metabolic panel per protocol     Status: None   Collection Time: 03/24/22  7:42 AM  Result Value Ref Range   Sodium 138 135 - 145 mmol/L   Potassium 3.7 3.5 - 5.1 mmol/L   Chloride 105 98 - 111 mmol/L   CO2 23 22 - 32 mmol/L   Glucose, Bld  79 70 - 99 mg/dL    Comment: Glucose reference range applies only to samples taken after fasting for at least 8 hours.   BUN 7 6 - 20 mg/dL   Creatinine, Ser 0.89 0.44 - 1.00 mg/dL   Calcium 9.3 8.9 - 10.3 mg/dL   GFR, Estimated >60 >60 mL/min    Comment: (NOTE) Calculated using the CKD-EPI Creatinine Equation (2021)    Anion gap 10 5 - 15    Comment: Performed at [facility] [address], [place], [place] [address]   No results found.  Review of Systems past history of hypertension migraines.  Positive for recent fainting episode.  Neck negative for bleeding or anesthetic problems.  Blood pressure (!) 142/89, pulse 65,  temperature 97.8 F (36.6 C), temperature source Oral, resp. rate 16, height 5\' 5"  (1.651 m), weight 88.9 kg, last menstrual period 02/11/2022, SpO2 97 %. Physical Exam Constitutional:      Appearance: Normal appearance.  HENT:     Head: Normocephalic.     Right Ear: External ear normal.     Left Ear: External ear normal.     Nose: Nose normal.  Eyes:     Extraocular Movements: Extraocular movements intact.  Cardiovascular:     Rate and Rhythm: Normal rate.     Pulses: Normal pulses.  Pulmonary:     Effort: Pulmonary effort is normal.  Abdominal:     General: Abdomen is flat.  Musculoskeletal:        General: Normal range of motion.     Cervical back: Normal range of motion. No rigidity.  Lymphadenopathy:     Cervical: No cervical adenopathy.  Skin:    Capillary Refill: Capillary refill takes less than 2 seconds.  Neurological:     Mental Status: She is alert.  Psychiatric:        Mood and Affect: Mood normal.        Behavior: Behavior normal.      Assessment/Plan Left carpal tunnel syndrome for left carpal tunnel release.  Recent right carpal tunnel release patient understands and request to proceed.  Marybelle Killings, MD 03/24/2022, 9:03 AM

## 2022-03-24 NOTE — Interval H&P Note (Signed)
History and Physical Interval Note:  03/24/2022 9:14 AM  Toni Kim  has presented today for surgery, with the diagnosis of LEFT CARPAL TUNNEL SYNDROME.  The various methods of treatment have been discussed with the patient and family. After consideration of risks, benefits and other options for treatment, the patient has consented to  Procedure(s): LEFT CARPAL TUNNEL RELEASE (Left) as a surgical intervention.  The patient's history has been reviewed, patient examined, no change in status, stable for surgery.  I have reviewed the patient's chart and labs.  Questions were answered to the patient's satisfaction.     Marybelle Killings

## 2022-03-24 NOTE — Anesthesia Preprocedure Evaluation (Addendum)
Anesthesia Evaluation  Patient identified by MRN, date of birth, ID band Patient awake    Reviewed: Allergy & Precautions, NPO status , Patient's Chart, lab work & pertinent test results  Airway Mallampati: III  TM Distance: >3 FB Neck ROM: Full    Dental no notable dental hx.    Pulmonary Current Smoker,  Smoking 2-3cigg/d   Pulmonary exam normal breath sounds clear to auscultation       Cardiovascular hypertension (142/89 preop), Pt. on medications Normal cardiovascular exam Rhythm:Regular Rate:Normal     Neuro/Psych  Headaches, negative psych ROS   GI/Hepatic Neg liver ROS, hiatal hernia, GERD  Controlled,  Endo/Other  Obesity BMI 33  Renal/GU negative Renal ROS  negative genitourinary   Musculoskeletal  (+) Arthritis , Osteoarthritis,    Abdominal   Peds negative pediatric ROS (+)  Hematology negative hematology ROS (+)   Anesthesia Other Findings   Reproductive/Obstetrics negative OB ROS Urine preg neg today                             Anesthesia Physical Anesthesia Plan  ASA: 2  Anesthesia Plan: MAC   Post-op Pain Management: Tylenol PO (pre-op)*   Induction:   PONV Risk Score and Plan: 3 and Ondansetron, Treatment may vary due to age or medical condition, Propofol infusion, TIVA and Midazolam  Airway Management Planned: Natural Airway and Simple Face Mask  Additional Equipment: None  Intra-op Plan:   Post-operative Plan:   Informed Consent: I have reviewed the patients History and Physical, chart, labs and discussed the procedure including the risks, benefits and alternatives for the proposed anesthesia with the patient or authorized representative who has indicated his/her understanding and acceptance.     Dental advisory given  Plan Discussed with: CRNA  Anesthesia Plan Comments:        Anesthesia Quick Evaluation

## 2022-03-24 NOTE — Op Note (Signed)
Pre and postop diagnosis: Left carpal tunnel syndrome  Procedure: Left carpal tunnel release  Surgeon: Rodell Perna, MD  Anesthesia: MAC +5 cc lidocaine Marcaine local one-to-one.  Tourniquet: 200 mm pressure at x5 minutes.  Findings median nerve compression in the carpal canal.  Procedure: After informed consent prepping with DuraPrep forearm tourniquet usual extremity sheets and drapes were applied timeout procedure completed.  Sterile skin marker was used and local was added Marcaine and lidocaine one-to-one mixture total of 5 cc.  Hand was elevated wrapped with an Esmarch tourniquet tourniquet inflated.  Incision was made stopping at the distal wrist crease.  Subtenons tissue was divided bipolar cautery was used for hemostasis.  Palmar fascia was divided and palmaris brevis muscle was divided transverse carpal ligament was divided along the ulnar aspect of the median nerve there was hourglass deformity of the nerve and hyperemia of the nerve with tourniquet deflation.  Bipolar bipolar cautery was used for hemostasis irrigation with saline solution and then with operative field dry skin was closed with 3-0 nylon interrupted simple sutures.  Xeroform 4 x 4's 20 years soft dressing was applied and patient tolerated procedure well transferred to cover room in stable condition.

## 2022-03-24 NOTE — Discharge Instructions (Addendum)
Keep hand elevated above heart.  Pain medication was sent into your pharmacy that she can pick up.  Leave dressing on until you return in 1 week for dressing change at the office.  Sutures will stay in for 2 weeks exactly like her opposite hand.  You may use ice off and on over the incision to help with the pain.  You can make an appointment to see Benjiman Core, PA-C to have your dressing changed and then wrist splint applied which she will continue to wear to return 2 weeks after the surgery.   Post Anesthesia Home Care Instructions  Activity: Get plenty of rest for the remainder of the day. A responsible individual must stay with you for 24 hours following the procedure.  For the next 24 hours, DO NOT: -Drive a car -Paediatric nurse -Drink alcoholic beverages -Take any medication unless instructed by your physician -Make any legal decisions or sign important papers.  Meals: Start with liquid foods such as gelatin or soup. Progress to regular foods as tolerated. Avoid greasy, spicy, heavy foods. If nausea and/or vomiting occur, drink only clear liquids until the nausea and/or vomiting subsides. Call your physician if vomiting continues.  Special Instructions/Symptoms: Your throat may feel dry or sore from the anesthesia or the breathing tube placed in your throat during surgery. If this causes discomfort, gargle with warm salt water. The discomfort should disappear within 24 hours.  If you had a scopolamine patch placed behind your ear for the management of post- operative nausea and/or vomiting:  1. The medication in the patch is effective for 72 hours, after which it should be removed.  Wrap patch in a tissue and discard in the trash. Wash hands thoroughly with soap and water. 2. You may remove the patch earlier than 72 hours if you experience unpleasant side effects which may include dry mouth, dizziness or visual disturbances. 3. Avoid touching the patch. Wash your hands with soap and  water after contact with the patch.

## 2022-03-24 NOTE — Transfer of Care (Signed)
Immediate Anesthesia Transfer of Care Note  Patient: Toni Kim  Procedure(s) Performed: LEFT CARPAL TUNNEL RELEASE (Left: Wrist)  Patient Location: PACU  Anesthesia Type:MAC  Level of Consciousness: awake, alert  and oriented  Airway & Oxygen Therapy: Patient Spontanous Breathing and Patient connected to face mask oxygen  Post-op Assessment: Report given to RN and Post -op Vital signs reviewed and stable  Post vital signs: Reviewed and stable  Last Vitals:  Vitals Value Taken Time  BP    Temp    Pulse    Resp    SpO2      Last Pain:  Vitals:   03/24/22 0749  TempSrc: Oral  PainSc: 0-No pain      Patients Stated Pain Goal: 4 (97/35/32 9924)  Complications: No notable events documented.

## 2022-03-25 ENCOUNTER — Encounter (HOSPITAL_BASED_OUTPATIENT_CLINIC_OR_DEPARTMENT_OTHER): Payer: Self-pay | Admitting: Orthopaedic Surgery

## 2022-04-03 ENCOUNTER — Encounter: Payer: Self-pay | Admitting: Surgery

## 2022-04-03 ENCOUNTER — Ambulatory Visit (INDEPENDENT_AMBULATORY_CARE_PROVIDER_SITE_OTHER): Payer: Medicaid Other | Admitting: Surgery

## 2022-04-03 VITALS — BP 155/87 | HR 83 | Ht 65.0 in | Wt 195.0 lb

## 2022-04-03 DIAGNOSIS — G5602 Carpal tunnel syndrome, left upper limb: Secondary | ICD-10-CM | POA: Diagnosis not present

## 2022-04-03 DIAGNOSIS — Z9889 Other specified postprocedural states: Secondary | ICD-10-CM

## 2022-04-03 NOTE — Progress Notes (Signed)
46 year old female who is 9 days status post left carpal tunnel lease returns.  States that she is doing well.  Exam Pleasant female alert and oriented in no acute stress.  Wound looks good.  Sutures intact.  No drainage or signs of infection.  Plan Encourage patient to work gentle range of motion of her fingers and wrist.  No aggressive activity.  Follow-up Dr. Lorin Mercy in 1 week at the [facility] clinic for wound check and possible suture removal.  Call if there is any questions or concerns before that time.

## 2022-04-10 ENCOUNTER — Ambulatory Visit (INDEPENDENT_AMBULATORY_CARE_PROVIDER_SITE_OTHER): Payer: Medicaid Other | Admitting: Orthopaedic Surgery

## 2022-04-10 ENCOUNTER — Encounter: Payer: Self-pay | Admitting: Orthopaedic Surgery

## 2022-04-10 VITALS — Ht 65.0 in | Wt 195.0 lb

## 2022-04-10 DIAGNOSIS — G5602 Carpal tunnel syndrome, left upper limb: Secondary | ICD-10-CM

## 2022-04-10 NOTE — Progress Notes (Signed)
   Post-Op Visit Note   Patient: Toni Kim           Date of Birth: May 25, 1976           MRN: [id number] Visit Date: 04/10/2022 PCP: Sharion Balloon, FNP   Assessment & Plan: Follow-up carpal tunnel release.  wound is slightly separated no drainage.  Return 1 week for wound check likely staple removal then.  Chief Complaint:  Chief Complaint  Patient presents with   Left Hand - Routine Post Op, Follow-up    03/24/2022 Left CTR   Visit Diagnoses:  1. Carpal tunnel syndrome, left upper limb     Plan: Recheck 1 week    Follow-Up Instructions: Return in about 1 week (around 04/17/2022).   Orders:  No orders of the defined types were placed in this encounter.  No orders of the defined types were placed in this encounter.   Imaging: No results found.  PMFS History: Patient Active Problem List   Diagnosis Date Noted   Carpal tunnel syndrome, left upper limb    Carpal tunnel syndrome, right upper limb 11/07/2021   S/P cervical spinal fusion 05/14/2017   Cervical spinal stenosis 03/27/2017   Cervical stenosis of spine 02/17/2017   Other spondylosis with radiculopathy, cervical region 01/08/2017   Essential hypertension 05/08/2015   Neck pain, chronic 09/18/2014   Back pain 12/01/2013   Past Medical History:  Diagnosis Date   Arthritis    GERD (gastroesophageal reflux disease)    H/O hiatal hernia    Headache    migranes   Hypertension     Family History  Problem Relation Age of Onset   Heart disease Mother        MI    Past Surgical History:  Procedure Laterality Date   ANTERIOR CERVICAL DECOMP/DISCECTOMY FUSION N/A 03/27/2017   Procedure: C4-5, C5-6 ANTERIOR CERVICAL DISCECTOMY AND FUSION, ALLOGRAFT, PLATE;  Surgeon: Marybelle Killings, MD;  Location: [place];  Service: Orthopedics;  Laterality: N/A;   BACK SURGERY     CARPAL TUNNEL RELEASE Right 12/02/2021   Procedure: RIGHT CARPAL TUNNEL RELEASE;  Surgeon: Marybelle Killings, MD;  Location: [place];  Service: Orthopedics;  Laterality: Right;   CARPAL TUNNEL RELEASE Left 03/24/2022   Procedure: LEFT CARPAL TUNNEL RELEASE;  Surgeon: Marybelle Killings, MD;  Location: [place];  Service: Orthopedics;  Laterality: Left;   CESAREAN SECTION     LUMBAR LAMINECTOMY/DECOMPRESSION MICRODISCECTOMY Right 12/21/2012   Procedure: LUMBAR LAMINECTOMY/DECOMPRESSION MICRODISCECTOMY 2 level;  Surgeon: Floyce Stakes, MD;  Location: [facility];  Service: Neurosurgery;  Laterality: Right;  Right Lumbar three-four,Lumbar four-five Microdiskectomy   SPINE SURGERY     TUBAL LIGATION     WISDOM TOOTH EXTRACTION     Social History   Occupational History   Not on file  Tobacco Use   Smoking status: Every Day    Packs/day: 0.50    Years: 10.00    Total pack years: 5.00    Types: Cigarettes   Smokeless tobacco: Never  Vaping Use   Vaping Use: Never used  Substance and Sexual Activity   Alcohol use: No   Drug use: Not Currently    Types: Marijuana    Comment: last 03/23/2017-uses for pain Quit yrs ago   Sexual activity: Not on file

## 2022-04-17 ENCOUNTER — Encounter: Payer: Medicaid Other | Admitting: Orthopaedic Surgery

## 2022-04-24 ENCOUNTER — Ambulatory Visit (INDEPENDENT_AMBULATORY_CARE_PROVIDER_SITE_OTHER): Payer: Medicaid Other | Admitting: Orthopaedic Surgery

## 2022-04-24 ENCOUNTER — Encounter: Payer: Self-pay | Admitting: Orthopaedic Surgery

## 2022-04-24 VITALS — Ht 65.0 in | Wt 195.0 lb

## 2022-04-24 DIAGNOSIS — G5602 Carpal tunnel syndrome, left upper limb: Secondary | ICD-10-CM

## 2022-04-24 NOTE — Progress Notes (Signed)
   Post-Op Visit Note   Patient: Toni Kim           Date of Birth: 1976/01/17           MRN: [id number] Visit Date: 04/24/2022 PCP: Junie Spencer, FNP   Assessment & Plan: Postop carpal tunnel release.  Wound is spread slightly sutures are harvested today.  Its been a month since her surgery.  Opposite hand had no problems healing.  She did wash her hand with antibacterial soap and then continue with the splint.  Recheck 3 weeks.  Chief Complaint:  Chief Complaint  Patient presents with   Left Hand - Follow-up, Routine Post Op    03/24/2022 Left CTR   Visit Diagnoses:  1. Carpal tunnel syndrome, left upper limb     Plan: ROV 3 wks  Follow-Up Instructions: Return in about 3 weeks (around 05/15/2022).   Orders:  No orders of the defined types were placed in this encounter.  No orders of the defined types were placed in this encounter.   Imaging: No results found.  PMFS History: Patient Active Problem List   Diagnosis Date Noted   Carpal tunnel syndrome, left upper limb    Carpal tunnel syndrome, right upper limb 11/07/2021   S/P cervical spinal fusion 05/14/2017   Cervical spinal stenosis 03/27/2017   Cervical stenosis of spine 02/17/2017   Other spondylosis with radiculopathy, cervical region 01/08/2017   Essential hypertension 05/08/2015   Neck pain, chronic 09/18/2014   Back pain 12/01/2013   Past Medical History:  Diagnosis Date   Arthritis    GERD (gastroesophageal reflux disease)    H/O hiatal hernia    Headache    migranes   Hypertension     Family History  Problem Relation Age of Onset   Heart disease Mother        MI    Past Surgical History:  Procedure Laterality Date   ANTERIOR CERVICAL DECOMP/DISCECTOMY FUSION N/A 03/27/2017   Procedure: C4-5, C5-6 ANTERIOR CERVICAL DISCECTOMY AND FUSION, ALLOGRAFT, PLATE;  Surgeon: Eldred Manges, MD;  Location: [facility];  Service: Orthopedics;  Laterality: N/A;   BACK SURGERY     CARPAL TUNNEL RELEASE  Right 12/02/2021   Procedure: RIGHT CARPAL TUNNEL RELEASE;  Surgeon: Eldred Manges, MD;  Location: [facility];  Service: Orthopedics;  Laterality: Right;   CARPAL TUNNEL RELEASE Left 03/24/2022   Procedure: LEFT CARPAL TUNNEL RELEASE;  Surgeon: Eldred Manges, MD;  Location: [facility];  Service: Orthopedics;  Laterality: Left;   CESAREAN SECTION     LUMBAR LAMINECTOMY/DECOMPRESSION MICRODISCECTOMY Right 12/21/2012   Procedure: LUMBAR LAMINECTOMY/DECOMPRESSION MICRODISCECTOMY 2 level;  Surgeon: Karn Cassis, MD;  Location: [facility];  Service: Neurosurgery;  Laterality: Right;  Right Lumbar three-four,Lumbar four-five Microdiskectomy   SPINE SURGERY     TUBAL LIGATION     WISDOM TOOTH EXTRACTION     Social History   Occupational History   Not on file  Tobacco Use   Smoking status: Every Day    Packs/day: 0.50    Years: 10.00    Total pack years: 5.00    Types: Cigarettes   Smokeless tobacco: Never  Vaping Use   Vaping Use: Never used  Substance and Sexual Activity   Alcohol use: No   Drug use: Not Currently    Types: Marijuana    Comment: last 03/23/2017-uses for pain Quit yrs ago   Sexual activity: Not on file

## 2022-05-15 ENCOUNTER — Ambulatory Visit (INDEPENDENT_AMBULATORY_CARE_PROVIDER_SITE_OTHER): Payer: Medicaid Other | Admitting: Orthopaedic Surgery

## 2022-05-15 ENCOUNTER — Encounter: Payer: Self-pay | Admitting: Orthopaedic Surgery

## 2022-05-15 VITALS — Ht 65.0 in | Wt 195.0 lb

## 2022-05-15 DIAGNOSIS — G5602 Carpal tunnel syndrome, left upper limb: Secondary | ICD-10-CM

## 2022-05-15 NOTE — Progress Notes (Signed)
Follow-up left carpal tunnel release.  She is already had the right one done.  Good relief of preop symptoms she can apply lotion to the incision.  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  She states she is to look for an easier time to work activity and can follow-up here on an as-needed basis.

## 2023-03-20 IMAGING — DX DG HIP (WITH OR WITHOUT PELVIS) 2-3V*R*
3 series · 3 of 3 positions shown · non-contrast
Comparison: Lumbar spine series report 12/21/2012.

CLINICAL DATA: History of right hip pain.

EXAM:
DG HIP (WITH OR WITHOUT PELVIS) 2-3V RIGHT

[pelvis ap]
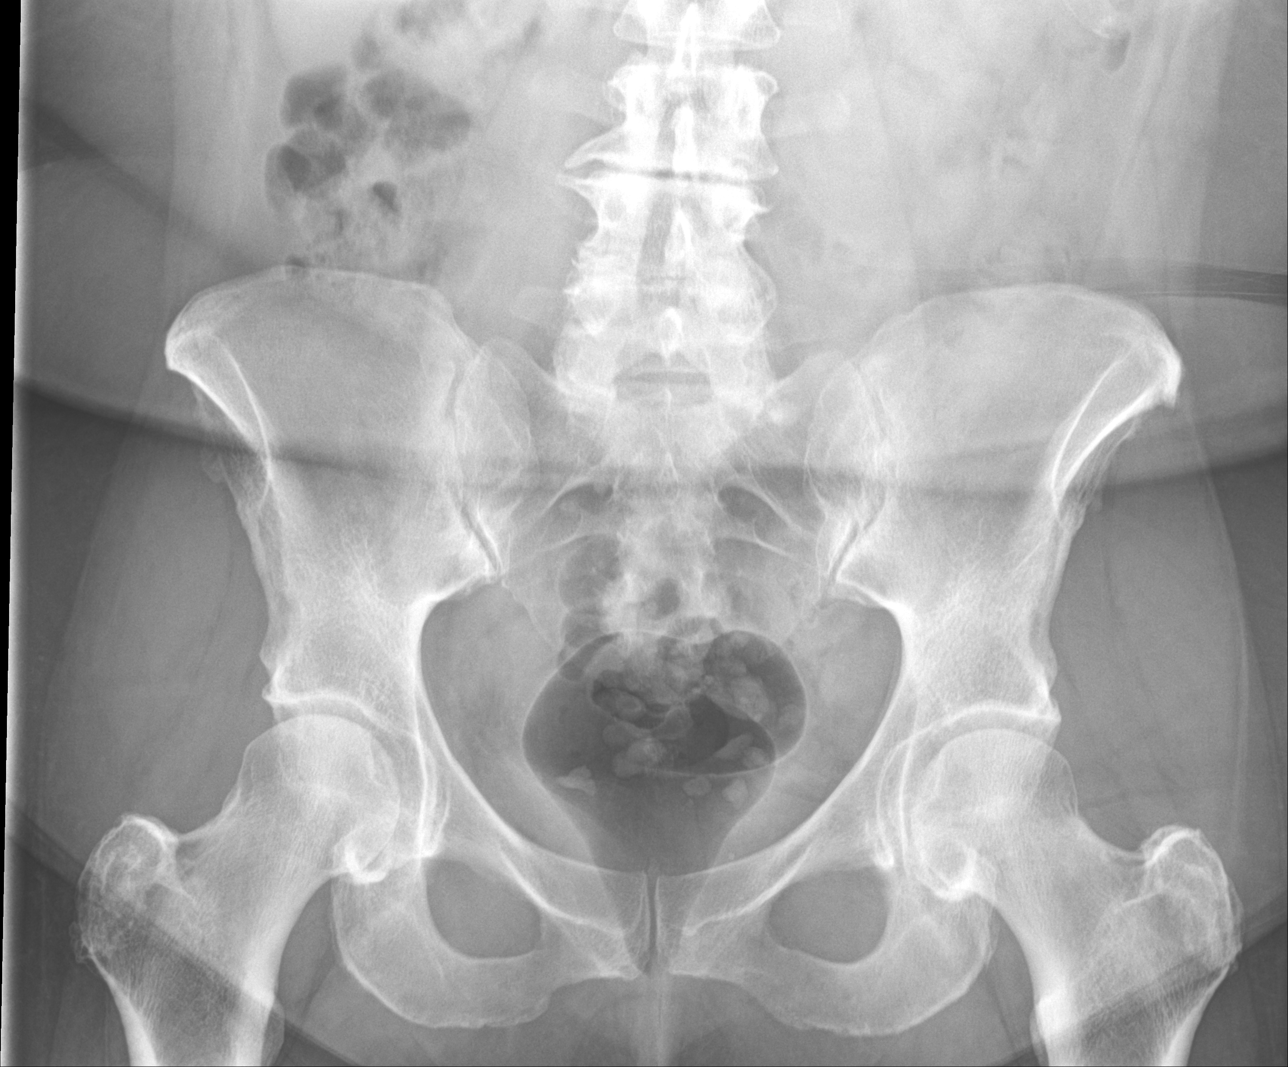

[hip ap]
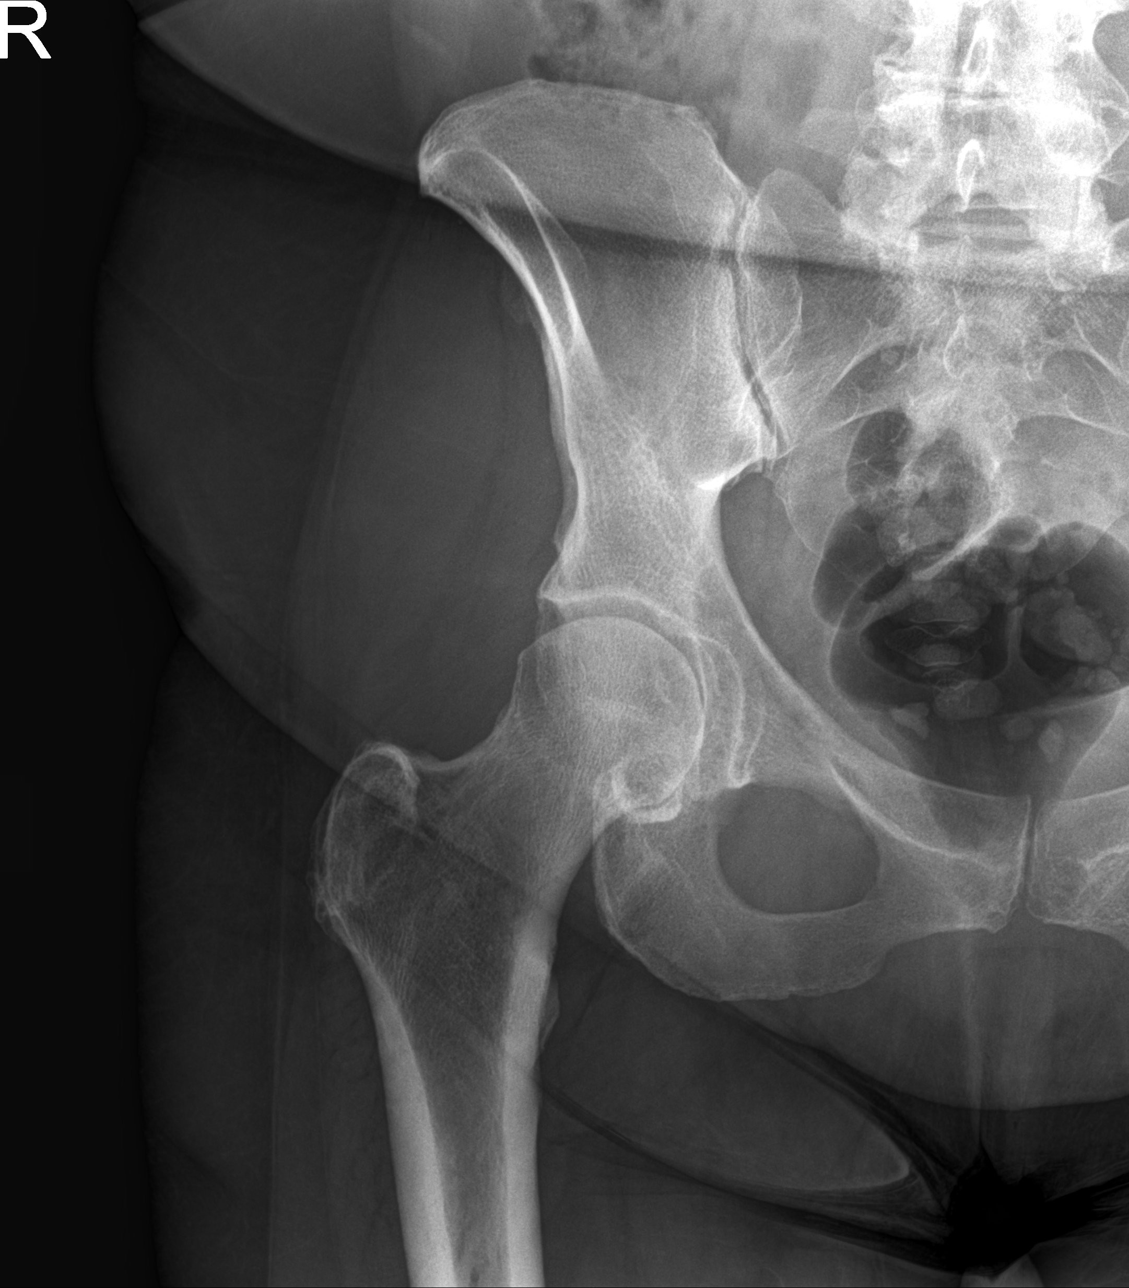

[hip lat]
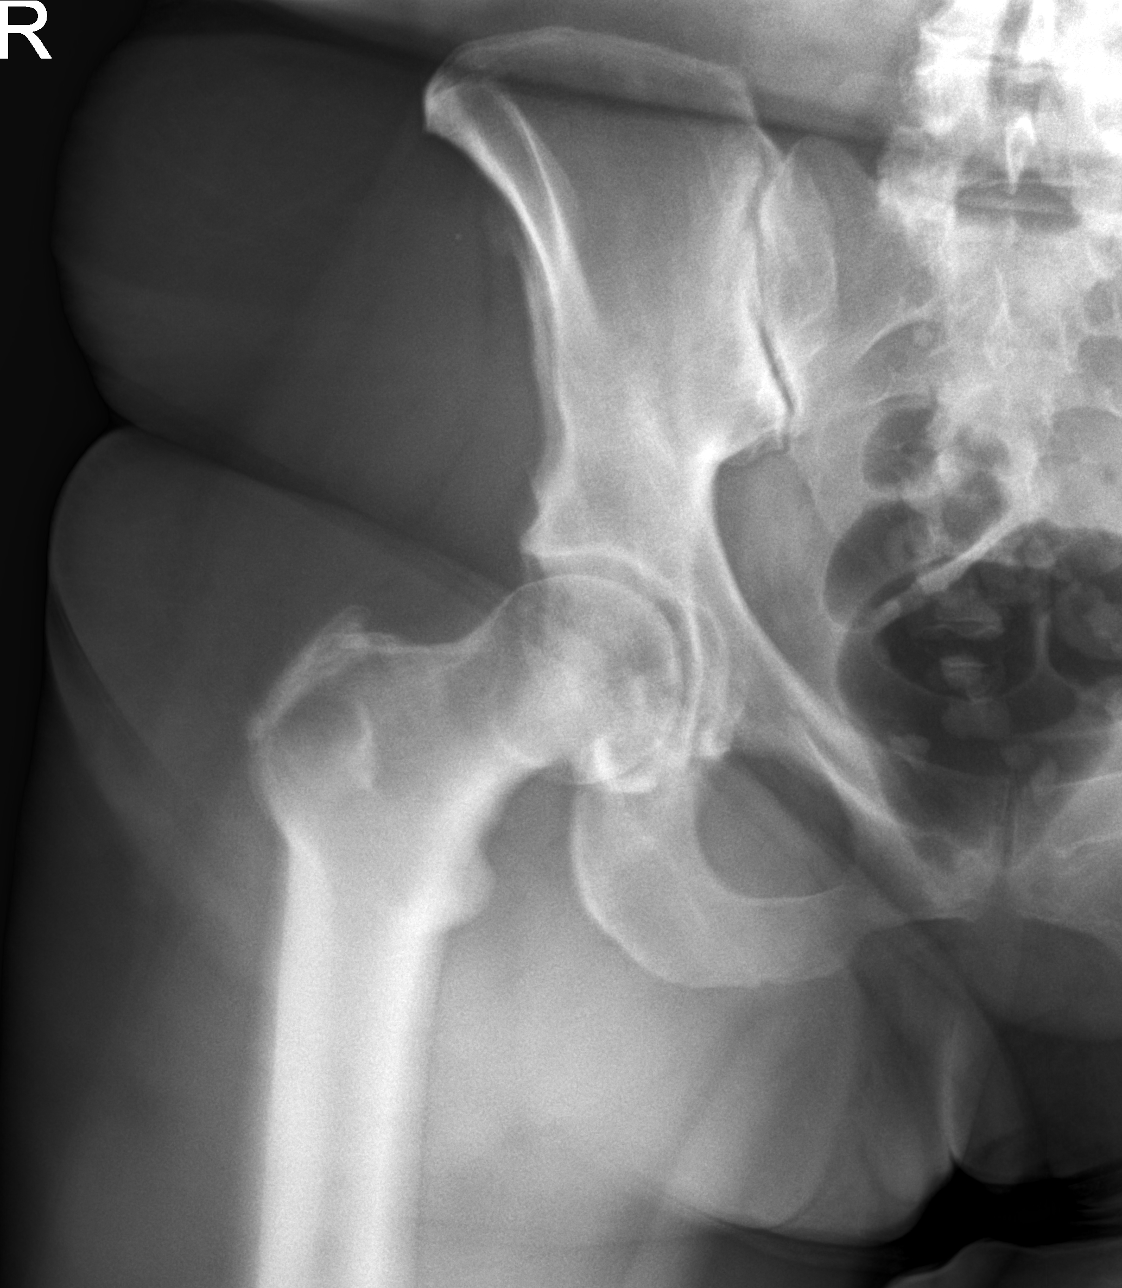

[3 of 3 positions shown; findings below may reference images not displayed]

FINDINGS: Limited exam as motion artifact noted over the right hip on the
oblique image. Diffuse degenerative change noted of the lower lumbar
spine, both SI joints, and both hips. No acute or focal bony
abnormality identified. Pelvic calcifications consistent with
phleboliths.
IMPRESSION: 1. Limited exam as motion artifact is noted over the right hip on
the oblique image. No acute right hip abnormality identified on the
AP view of the pelvis and AP view of the right hip.

2. Degenerative changes lower lumbar spine, both SI joints, both
hips.

## 2023-07-07 ENCOUNTER — Ambulatory Visit: Payer: Medicaid Other | Admitting: Family

## 2023-07-09 ENCOUNTER — Encounter: Payer: Self-pay | Admitting: Family

## 2023-10-16 ENCOUNTER — Encounter: Payer: Self-pay | Admitting: Radiology

## 2024-03-07 ENCOUNTER — Ambulatory Visit (INDEPENDENT_AMBULATORY_CARE_PROVIDER_SITE_OTHER): Admitting: Nurse Practitioner

## 2024-03-07 ENCOUNTER — Encounter: Payer: Self-pay | Admitting: Nurse Practitioner

## 2024-03-07 VITALS — BP 137/85 | HR 86 | Temp 97.1°F | Ht 65.0 in | Wt 187.0 lb

## 2024-03-07 DIAGNOSIS — M542 Cervicalgia: Secondary | ICD-10-CM

## 2024-03-07 DIAGNOSIS — I10 Essential (primary) hypertension: Secondary | ICD-10-CM | POA: Diagnosis not present

## 2024-03-07 DIAGNOSIS — J3089 Other allergic rhinitis: Secondary | ICD-10-CM

## 2024-03-07 MED ORDER — CELECOXIB 200 MG PO CAPS: 200.0000 mg | ORAL_CAPSULE | Freq: Two times a day (BID) | ORAL | 2 refills | Status: DC

## 2024-03-07 MED ORDER — METHYLPREDNISOLONE ACETATE 80 MG/ML IJ SUSP
80.0000 mg | Freq: Once | INTRAMUSCULAR | Status: AC
  Administered 2024-03-07: 80 mg via INTRAMUSCULAR

## 2024-03-07 MED ORDER — AMLODIPINE BESYLATE 5 MG PO TABS: 5.0000 mg | ORAL_TABLET | Freq: Every day | ORAL | 1 refills | Status: DC

## 2024-03-07 MED ORDER — FLUTICASONE PROPIONATE 50 MCG/ACT NA SUSP: 2.0000 | Freq: Every day | NASAL | 6 refills | Status: DC

## 2024-03-07 MED ORDER — HYDROCHLOROTHIAZIDE 12.5 MG PO CAPS: 12.5000 mg | ORAL_CAPSULE | Freq: Every day | ORAL | 1 refills | Status: DC

## 2024-03-07 NOTE — Progress Notes (Signed)
 Subjective:    Patient ID: Toni Kim, female    DOB: 1975-07-19, 48 y.o.   MRN: [id number]   Chief Complaint: Neck Pain (Radiating down to arms and hands. Right arm is swollen/Hips and legs also hurting when she lays down) and Sinus Problem   Neck Pain  Pertinent negatives include no fever.  Sinus Problem Associated symptoms include congestion and neck pain.    Patient in today with 2 complaints: - neck pain- patient says neck pain is chronic. Has had surgery in the past and has plates and screws. Sees Dr. Barbarann. Now is having sharp pains radiating down neck into shoulder. Sharp pain is quick but then will get achy pain lasting several hours. She takes tylenol   or goody powder. Neither help.  - sinus problems- nasal drainage, PND. No pain- slight cough. Has been going on for over a month. Has been taking Claritin and cough meds. No fever.  - fluid pill is causing leg cramps Patient Active Problem List   Diagnosis Date Noted   Carpal tunnel syndrome, left upper limb    Carpal tunnel syndrome, right upper limb 11/07/2021   S/P cervical spinal fusion 05/14/2017   Cervical spinal stenosis 03/27/2017   Cervical stenosis of spine 02/17/2017   Other spondylosis with radiculopathy, cervical region 01/08/2017   Essential hypertension 05/08/2015   Neck pain, chronic 09/18/2014   Back pain 12/01/2013       Review of Systems  Constitutional:  Negative for fever.  HENT:  Positive for congestion, postnasal drip and rhinorrhea. Negative for sinus pain.   Musculoskeletal:  Positive for neck pain.       Objective:   Physical Exam Constitutional:      Appearance: Normal appearance. She is obese.  HENT:     Right Ear: A middle ear effusion (clear) is present.     Left Ear: A middle ear effusion (clear) is present.     Nose: Congestion and rhinorrhea present.     Mouth/Throat:     Pharynx: No oropharyngeal exudate or posterior oropharyngeal erythema.  Cardiovascular:     Rate and  Rhythm: Normal rate and regular rhythm.  Pulmonary:     Effort: Pulmonary effort is normal.     Breath sounds: Normal breath sounds.  Neurological:     General: No focal deficit present.     Mental Status: She is alert and oriented to person, place, and time.  Psychiatric:        Mood and Affect: Mood normal.        Behavior: Behavior normal.    BP 137/85   Pulse 86   Temp (!) 97.1 F (36.2 C) (Temporal)   Ht 5' 5 (1.651 m)   Wt 187 lb (84.8 kg)   SpO2 92%   BMI 31.12 kg/m         Assessment & Plan:   Toni Kim in today with chief complaint of Neck Pain (Radiating down to arms and hands. Right arm is swollen/Hips and legs also hurting when she lays down) and Sinus Problem   1. Essential hypertension Labs pending Changed from hydrochlorothiazide  to amlodipine  Keep diary of blood pressure - amLODipine  (NORVASC ) 5 MG tablet; Take 1 tablet (5 mg total) by mouth daily.  Dispense: 90 tablet; Refill: 1 - CMP14+EGFR  2. Non-seasonal allergic rhinitis, unspecified trigger (Primary) Force fluids Take claritin at night - fluticasone  (FLONASE ) 50 MCG/ACT nasal spray; Place 2 sprays into both nostrils daily.  Dispense: 16 g; Refill: 6  3. Neck pain Moist heat Rest Start celebrex  - Ambulatory referral to Orthopedic Surgery - methylPREDNISolone  acetate (DEPO-MEDROL ) injection 80 mg - celecoxib  (CELEBREX ) 200 MG capsule; Take 1 capsule (200 mg total) by mouth 2 (two) times daily.  Dispense: 60 capsule; Refill: 2   The above assessment and management plan was discussed with the patient. The patient verbalized understanding of and has agreed to the management plan. Patient is aware to call the clinic if symptoms persist or worsen. Patient is aware when to return to the clinic for a follow-up visit. Patient educated on when it is appropriate to go to the emergency department.   Mary-Margaret Gladis, FNP

## 2024-03-07 NOTE — Patient Instructions (Signed)

## 2024-03-08 ENCOUNTER — Telehealth: Payer: Self-pay | Admitting: Family Medicine

## 2024-03-08 ENCOUNTER — Ambulatory Visit: Payer: Self-pay | Admitting: Nurse Practitioner

## 2024-03-08 LAB — CMP14+EGFR
ALT: 10 IU/L (ref 0–32)
AST: 13 IU/L (ref 0–40)
Albumin: 4.5 g/dL (ref 3.9–4.9)
Alkaline Phosphatase: 127 IU/L — ABNORMAL HIGH (ref 41–116)
BUN/Creatinine Ratio: 9 (ref 9–23)
BUN: 9 mg/dL (ref 6–24)
Bilirubin Total: 0.3 mg/dL (ref 0.0–1.2)
CO2: 19 mmol/L — ABNORMAL LOW (ref 20–29)
Calcium: 9.7 mg/dL (ref 8.7–10.2)
Chloride: 105 mmol/L (ref 96–106)
Creatinine, Ser: 0.99 mg/dL (ref 0.57–1.00)
Globulin, Total: 3.1 g/dL (ref 1.5–4.5)
Glucose: 86 mg/dL (ref 70–99)
Potassium: 4.1 mmol/L (ref 3.5–5.2)
Sodium: 142 mmol/L (ref 134–144)
Total Protein: 7.6 g/dL (ref 6.0–8.5)
eGFR: 71 mL/min/1.73 (ref >59)

## 2024-03-08 NOTE — Telephone Encounter (Signed)
Patient aware and verbalized understanding. Appt made 

## 2024-03-08 NOTE — Telephone Encounter (Signed)
 Per MMM note it states her hydrochlorothiazide  was changed to Norvasc  5 mg. Please make sure she has a follow up appointment with me.

## 2024-03-08 NOTE — Telephone Encounter (Signed)
 Copied from CRM [id number]. Topic: Clinical - Medical Advice >> Mar 08, 2024  8:20 AM Cherylann RAMAN wrote: Reason for CRM: Patient requesting information on how to take the new blood pressure medicine. Patient has amLODipine  (NORVASC ) 5 MG tablet and hydrochlorothiazide  (MICROZIDE ) 12.5 MG capsule. Please advise. Patient can be contacted at [phone].

## 2024-03-10 ENCOUNTER — Encounter: Payer: Self-pay | Admitting: Family

## 2024-03-10 ENCOUNTER — Ambulatory Visit (INDEPENDENT_AMBULATORY_CARE_PROVIDER_SITE_OTHER): Admitting: Family

## 2024-03-10 VITALS — BP 146/93 | HR 92 | Temp 97.3°F | Wt 189.8 lb

## 2024-03-10 DIAGNOSIS — I10 Essential (primary) hypertension: Secondary | ICD-10-CM | POA: Diagnosis not present

## 2024-03-10 DIAGNOSIS — F17200 Nicotine dependence, unspecified, uncomplicated: Secondary | ICD-10-CM

## 2024-03-10 DIAGNOSIS — J3089 Other allergic rhinitis: Secondary | ICD-10-CM | POA: Diagnosis not present

## 2024-03-10 DIAGNOSIS — M5412 Radiculopathy, cervical region: Secondary | ICD-10-CM

## 2024-03-10 DIAGNOSIS — Z0000 Encounter for general adult medical examination without abnormal findings: Secondary | ICD-10-CM

## 2024-03-10 DIAGNOSIS — Z0001 Encounter for general adult medical examination with abnormal findings: Secondary | ICD-10-CM

## 2024-03-10 DIAGNOSIS — M150 Primary generalized (osteo)arthritis: Secondary | ICD-10-CM

## 2024-03-10 MED ORDER — AMLODIPINE BESYLATE 5 MG PO TABS: 5.0000 mg | ORAL_TABLET | Freq: Every day | ORAL | 1 refills | Status: AC

## 2024-03-10 MED ORDER — CELECOXIB 200 MG PO CAPS: 200.0000 mg | ORAL_CAPSULE | Freq: Two times a day (BID) | ORAL | 2 refills | Status: AC

## 2024-03-10 MED ORDER — BACLOFEN 10 MG PO TABS: 10.0000 mg | ORAL_TABLET | Freq: Three times a day (TID) | ORAL | 2 refills | Status: AC

## 2024-03-10 NOTE — Progress Notes (Signed)
 Subjective:    Patient ID: Toni Kim, female    DOB: [birth year], 48 y.o.   MRN: [id number]  Chief Complaint  Patient presents with   Medical Management of Chronic Issues    rCK    Hip Pain    bIL HIP PAIN AND IN KNEES.   PT presents to the office today for CPE without pap.    Hypertension This is a chronic problem. The current episode started more than 1 year ago. The problem has been waxing and waning since onset. The problem is uncontrolled. Associated symptoms include neck pain. Pertinent negatives include no malaise/fatigue, peripheral edema or shortness of breath. Risk factors for coronary artery disease include obesity. Past treatments include calcium channel blockers. The current treatment provides mild improvement.  Hip Pain  The incident occurred more than 1 week ago. There was no injury mechanism. The pain is present in the right hip and left hip. The quality of the pain is described as aching. The pain is at a severity of 10/10. The pain is moderate. The pain has been Intermittent since onset. She has tried rest and NSAIDs for the symptoms. The treatment provided mild relief.  Nicotine Dependence Presents for follow-up visit. Her urge triggers include company of smokers. She smokes < 1/2 a pack of cigarettes per day.  Neck Pain  This is a chronic problem. The current episode started more than 1 year ago. The problem has been waxing and waning. The pain is present in the right side. The quality of the pain is described as aching. The pain is at a severity of 10/10. The pain is moderate. She has tried NSAIDs for the symptoms. The treatment provided mild relief.      Review of Systems  Constitutional:  Negative for malaise/fatigue.  Respiratory:  Negative for shortness of breath.   Musculoskeletal:  Positive for neck pain.  All other systems reviewed and are negative.  Family History  Problem Relation Age of Onset   Heart disease Mother        MI   Social History    Socioeconomic History   Marital status: Single    Spouse name: Not on file   Number of children: Not on file   Years of education: Not on file   Highest education level: Not on file  Occupational History   Not on file  Tobacco Use   Smoking status: Every Day    Current packs/day: 0.50    Average packs/day: 0.5 packs/day for 10.0 years (5.0 ttl pk-yrs)    Types: Cigarettes   Smokeless tobacco: Never  Vaping Use   Vaping status: Never Used  Substance and Sexual Activity   Alcohol use: No   Drug use: Not Currently    Types: Marijuana    Comment: last 03/23/2017-uses for pain Quit yrs ago   Sexual activity: Not on file  Other Topics Concern   Not on file  Social History Narrative   Not on file   Social Drivers of Health   Financial Resource Strain: Not on file  Food Insecurity: Not on file  Transportation Needs: Not on file  Physical Activity: Not on file  Stress: Not on file  Social Connections: Not on file       Objective:   Physical Exam Vitals reviewed.  Constitutional:      General: She is not in acute distress.    Appearance: She is well-developed. She is obese.  HENT:     Head: Normocephalic and atraumatic.  Right Ear: Tympanic membrane normal.     Left Ear: Tympanic membrane normal.  Eyes:     Pupils: Pupils are equal, round, and reactive to light.  Neck:     Thyroid: No thyromegaly.  Cardiovascular:     Rate and Rhythm: Normal rate and regular rhythm.     Heart sounds: Normal heart sounds. No murmur heard. Pulmonary:     Effort: Pulmonary effort is normal. No respiratory distress.     Breath sounds: Normal breath sounds. No wheezing.  Abdominal:     General: Bowel sounds are normal. There is no distension.     Palpations: Abdomen is soft.     Tenderness: There is no abdominal tenderness.  Musculoskeletal:        General: No tenderness. Normal range of motion.     Cervical back: Normal range of motion and neck supple.  Skin:    General: Skin  is warm and dry.  Neurological:     Mental Status: She is alert and oriented to person, place, and time.     Cranial Nerves: No cranial nerve deficit.     Deep Tendon Reflexes: Reflexes are normal and symmetric.  Psychiatric:        Behavior: Behavior normal.        Thought Content: Thought content normal.        Judgment: Judgment normal.     BP (!) 141/105   Pulse 92   Temp (!) 97.3 F (36.3 C) (Temporal)   Wt 189 lb 12.8 oz (86.1 kg)   BMI 31.58 kg/m      Assessment & Plan:  Kasen Sako comes in today with chief complaint of Medical Management of Chronic Issues (rCK ) and Hip Pain (bIL HIP PAIN AND IN KNEES.)   Diagnosis and orders addressed:  1. Annual physical exam (Primary) - CMP14+EGFR; Future - CBC with Differential/Platelet; Future - Lipid panel; Future - TSH; Future  2. Essential hypertension PT has not taken Norvasc  5 mg today Coming tomorrow for labs, will recheck BP at that time  3. Primary osteoarthritis involving multiple joints Continue celebrex   Will add baclofen  TID prn, sedation precautions discussed - baclofen  (LIORESAL ) 10 MG tablet; Take 1 tablet (10 mg total) by mouth 3 (three) times daily.  Dispense: 60 each; Refill: 2  4. Cervical radiculopathy Continue celebrex   Will add baclofen  TID prn, sedation precautions discussed  Health Maintenance reviewed Continue Celebrex , start baclofen  TID prn Continue Norvasc  5 mg Coming tomorrow for labs and will recheck BP at that time  Diet and exercise encouraged Keep Ortho appointment   Follow up plan: 6 months    Bari Learn, FNP

## 2024-03-10 NOTE — Patient Instructions (Signed)
 Hypertension, Adult High blood pressure (hypertension) is when the force of blood pumping through the arteries is too strong. The arteries are the blood vessels that carry blood from the heart throughout the body. Hypertension forces the heart to work harder to pump blood and may cause arteries to become narrow or stiff. Untreated or uncontrolled hypertension can lead to a heart attack, heart failure, a stroke, kidney disease, and other problems. A blood pressure reading consists of a higher number over a lower number. Ideally, your blood pressure should be below 120/80. The first ("top") number is called the systolic pressure. It is a measure of the pressure in your arteries as your heart beats. The second ("bottom") number is called the diastolic pressure. It is a measure of the pressure in your arteries as the heart relaxes. What are the causes? The exact cause of this condition is not known. There are some conditions that result in high blood pressure. What increases the risk? Certain factors may make you more likely to develop high blood pressure. Some of these risk factors are under your control, including: Smoking. Not getting enough exercise or physical activity. Being overweight. Having too much fat, sugar, calories, or salt (sodium) in your diet. Drinking too much alcohol. Other risk factors include: Having a personal history of heart disease, diabetes, high cholesterol, or kidney disease. Stress. Having a family history of high blood pressure and high cholesterol. Having obstructive sleep apnea. Age. The risk increases with age. What are the signs or symptoms? High blood pressure may not cause symptoms. Very high blood pressure (hypertensive crisis) may cause: Headache. Fast or irregular heartbeats (palpitations). Shortness of breath. Nosebleed. Nausea and vomiting. Vision changes. Severe chest pain, dizziness, and seizures. How is this diagnosed? This condition is diagnosed by  measuring your blood pressure while you are seated, with your arm resting on a flat surface, your legs uncrossed, and your feet flat on the floor. The cuff of the blood pressure monitor will be placed directly against the skin of your upper arm at the level of your heart. Blood pressure should be measured at least twice using the same arm. Certain conditions can cause a difference in blood pressure between your right and left arms. If you have a high blood pressure reading during one visit or you have normal blood pressure with other risk factors, you may be asked to: Return on a different day to have your blood pressure checked again. Monitor your blood pressure at home for 1 week or longer. If you are diagnosed with hypertension, you may have other blood or imaging tests to help your health care provider understand your overall risk for other conditions. How is this treated? This condition is treated by making healthy lifestyle changes, such as eating healthy foods, exercising more, and reducing your alcohol intake. You may be referred for counseling on a healthy diet and physical activity. Your health care provider may prescribe medicine if lifestyle changes are not enough to get your blood pressure under control and if: Your systolic blood pressure is above 130. Your diastolic blood pressure is above 80. Your personal target blood pressure may vary depending on your medical conditions, your age, and other factors. Follow these instructions at home: Eating and drinking  Eat a diet that is high in fiber and potassium, and low in sodium, added sugar, and fat. An example of this eating plan is called the DASH diet. DASH stands for Dietary Approaches to Stop Hypertension. To eat this way: Eat  plenty of fresh fruits and vegetables. Try to fill one half of your plate at each meal with fruits and vegetables. Eat whole grains, such as whole-wheat pasta, brown rice, or whole-grain bread. Fill about one  fourth of your plate with whole grains. Eat or drink low-fat dairy products, such as skim milk or low-fat yogurt. Avoid fatty cuts of meat, processed or cured meats, and poultry with skin. Fill about one fourth of your plate with lean proteins, such as fish, chicken without skin, beans, eggs, or tofu. Avoid pre-made and processed foods. These tend to be higher in sodium, added sugar, and fat. Reduce your daily sodium intake. Many people with hypertension should eat less than 1,500 mg of sodium a day. Do not drink alcohol if: Your health care provider tells you not to drink. You are pregnant, may be pregnant, or are planning to become pregnant. If you drink alcohol: Limit how much you have to: 0-1 drink a day for women. 0-2 drinks a day for men. Know how much alcohol is in your drink. In the [place] one drink equals one 12 oz bottle of beer (355 mL), one 5 oz glass of wine (148 mL), or one 1 oz glass of hard liquor (44 mL). Lifestyle  Work with your health care provider to maintain a healthy body weight or to lose weight. Ask what an ideal weight is for you. Get at least 30 minutes of exercise that causes your heart to beat faster (aerobic exercise) most days of the week. Activities may include walking, swimming, or biking. Include exercise to strengthen your muscles (resistance exercise), such as Pilates or lifting weights, as part of your weekly exercise routine. Try to do these types of exercises for 30 minutes at least 3 days a week. Do not use any products that contain nicotine or tobacco. These products include cigarettes, chewing tobacco, and vaping devices, such as e-cigarettes. If you need help quitting, ask your health care provider. Monitor your blood pressure at home as told by your health care provider. Keep all follow-up visits. This is important. Medicines Take over-the-counter and prescription medicines only as told by your health care provider. Follow directions carefully. Blood  pressure medicines must be taken as prescribed. Do not skip doses of blood pressure medicine. Doing this puts you at risk for problems and can make the medicine less effective. Ask your health care provider about side effects or reactions to medicines that you should watch for. Contact a health care provider if you: Think you are having a reaction to a medicine you are taking. Have headaches that keep coming back (recurring). Feel dizzy. Have swelling in your ankles. Have trouble with your vision. Get help right away if you: Develop a severe headache or confusion. Have unusual weakness or numbness. Feel faint. Have severe pain in your chest or abdomen. Vomit repeatedly. Have trouble breathing. These symptoms may be an emergency. Get help right away. Call 911. Do not wait to see if the symptoms will go away. Do not drive yourself to the hospital. Summary Hypertension is when the force of blood pumping through your arteries is too strong. If this condition is not controlled, it may put you at risk for serious complications. Your personal target blood pressure may vary depending on your medical conditions, your age, and other factors. For most people, a normal blood pressure is less than 120/80. Hypertension is treated with lifestyle changes, medicines, or a combination of both. Lifestyle changes include losing weight, eating a healthy,  low-sodium diet, exercising more, and limiting alcohol. This information is not intended to replace advice given to you by your health care provider. Make sure you discuss any questions you have with your health care provider. Document Revised: 04/09/2021 Document Reviewed: 04/09/2021 Elsevier Patient Education  2024 [employer].

## 2024-03-11 ENCOUNTER — Encounter: Payer: Self-pay | Admitting: Family

## 2024-03-11 ENCOUNTER — Other Ambulatory Visit

## 2024-03-22 ENCOUNTER — Encounter: Admitting: Physical Medicine and Rehabilitation

## 2024-03-23 ENCOUNTER — Encounter: Admitting: Orthopedic Surgery

## 2024-03-29 ENCOUNTER — Encounter: Admitting: Orthopedic Surgery

## 2024-03-30 ENCOUNTER — Encounter: Admitting: Physical Medicine and Rehabilitation

## 2024-04-13 ENCOUNTER — Encounter: Admitting: Physical Medicine and Rehabilitation

## 2024-04-18 ENCOUNTER — Encounter: Admitting: Orthopedic Surgery

## 2024-04-18 ENCOUNTER — Encounter: Payer: Self-pay | Admitting: Radiology

## 2024-04-27 ENCOUNTER — Other Ambulatory Visit (HOSPITAL_COMMUNITY)
Admission: RE | Admit: 2024-04-27 | Discharge: 2024-04-27 | Disposition: A | Discharge status: Home / Self Care | Source: Ambulatory Visit | Attending: Family Medicine | Admitting: Family Medicine

## 2024-04-27 ENCOUNTER — Encounter: Payer: Self-pay | Admitting: Family Medicine

## 2024-04-27 ENCOUNTER — Ambulatory Visit: Payer: Self-pay | Admitting: Family Medicine

## 2024-04-27 ENCOUNTER — Ambulatory Visit: Admitting: Family Medicine

## 2024-04-27 VITALS — BP 130/83 | HR 94 | Temp 97.0°F | Ht 65.0 in | Wt 183.0 lb

## 2024-04-27 DIAGNOSIS — R519 Headache, unspecified: Secondary | ICD-10-CM

## 2024-04-27 DIAGNOSIS — N898 Other specified noninflammatory disorders of vagina: Secondary | ICD-10-CM | POA: Diagnosis not present

## 2024-04-27 DIAGNOSIS — R10A3 Flank pain, bilateral: Secondary | ICD-10-CM

## 2024-04-27 DIAGNOSIS — R350 Frequency of micturition: Secondary | ICD-10-CM | POA: Diagnosis not present

## 2024-04-27 DIAGNOSIS — R300 Dysuria: Secondary | ICD-10-CM | POA: Diagnosis not present

## 2024-04-27 LAB — URINALYSIS, COMPLETE
Glucose, UA: NEGATIVE
Leukocytes,UA: NEGATIVE
Nitrite, UA: NEGATIVE
Specific Gravity, UA: 1.02 (ref 1.005–1.030)
Urobilinogen, Ur: 0.2 mg/dL (ref 0.2–1.0)
pH, UA: 5.5 (ref 5.0–7.5)

## 2024-04-27 LAB — MICROSCOPIC EXAMINATION
RBC, Urine: NONE SEEN /HPF (ref 0–2)
Renal Epithel, UA: NONE SEEN /HPF
Yeast, UA: NONE SEEN

## 2024-04-27 LAB — WET PREP FOR TRICH, YEAST, CLUE
Clue Cell Exam: NEGATIVE
Trichomonas Exam: NEGATIVE
Yeast Exam: NEGATIVE

## 2024-04-27 MED ORDER — FLUTICASONE PROPIONATE 50 MCG/ACT NA SUSP: 2.0000 | Freq: Every day | NASAL | 6 refills | Status: AC

## 2024-04-27 MED ORDER — KETOROLAC TROMETHAMINE 60 MG/2ML IM SOLN
60.0000 mg | Freq: Once | INTRAMUSCULAR | Status: AC
  Administered 2024-04-27: 60 mg via INTRAMUSCULAR

## 2024-04-27 NOTE — Progress Notes (Signed)
 Subjective:  Patient ID: Toni Kim, female    DOB: 05/24/1976, 48 y.o.   MRN: [id number]  Patient Care Team: Toni Kim LABOR, FNP as PCP - General (Nurse Practitioner)   Chief Complaint:  Back Pain (Right lower) and Urinary Frequency   HPI: Toni Kim is a 48 y.o. female presenting on 04/27/2024 for Back Pain (Right lower) and Urinary Frequency   Toni Kim is a 48 year old female who presents with right-sided lower back pain and urinary symptoms.  Right flank and lower back pain - Onset 2 days ago - Localized to right flank and lower back - Pain prevents working - No use of over-the-counter analgesics (Tylenol  or Motrin ) - Muscle relaxers prescribed by a physician provide some relief, especially for sleep  Urinary symptoms - Increased urinary frequency - Dark-colored urine - No visible hematuria - Increased oral fluid intake (water and cranberry juice) without symptom improvement  Constitutional symptoms - Chills yesterday - Generalized weakness  Oropharyngeal and sinus symptoms - Persistent unpleasant taste in mouth - Belief of possible sinus infection due to frequent sinus issues - Uses Flonase , especially at night for increased drainage  Vaginal symptoms - White vaginal discharge - Vaginal pruritus - No information provided regarding new sexual partners or timing of last sexual activity  Occupational activity - Works at [employer] - Job duties include running and bending          Relevant past medical, surgical, family, and social history reviewed and updated as indicated.  Allergies and medications reviewed and updated. Data reviewed: Chart in Epic.   Past Medical History:  Diagnosis Date   Arthritis    GERD (gastroesophageal reflux disease)    H/O hiatal hernia    Headache    migranes   Hypertension     Past Surgical History:  Procedure Laterality Date   ANTERIOR CERVICAL DECOMP/DISCECTOMY FUSION N/A 03/27/2017   Procedure: C4-5,  C5-6 ANTERIOR CERVICAL DISCECTOMY AND FUSION, ALLOGRAFT, PLATE;  Surgeon: Barbarann Oneil BROCKS, MD;  Location: [facility] OR;  Service: Orthopedics;  Laterality: N/A;   BACK SURGERY     CARPAL TUNNEL RELEASE Right 12/02/2021   Procedure: RIGHT CARPAL TUNNEL RELEASE;  Surgeon: Barbarann Oneil BROCKS, MD;  Location: [facility];  Service: Orthopedics;  Laterality: Right;   CARPAL TUNNEL RELEASE Left 03/24/2022   Procedure: LEFT CARPAL TUNNEL RELEASE;  Surgeon: Barbarann Oneil BROCKS, MD;  Location: [facility];  Service: Orthopedics;  Laterality: Left;   CESAREAN SECTION     LUMBAR LAMINECTOMY/DECOMPRESSION MICRODISCECTOMY Right 12/21/2012   Procedure: LUMBAR LAMINECTOMY/DECOMPRESSION MICRODISCECTOMY 2 level;  Surgeon: Catalina CHRISTELLA Stains, MD;  Location: [facility];  Service: Neurosurgery;  Laterality: Right;  Right Lumbar three-four,Lumbar four-five Microdiskectomy   SPINE SURGERY     TUBAL LIGATION     WISDOM TOOTH EXTRACTION      Social History   Socioeconomic History   Marital status: Single    Spouse name: Not on file   Number of children: Not on file   Years of education: Not on file   Highest education level: Not on file  Occupational History   Not on file  Tobacco Use   Smoking status: Every Day    Current packs/day: 0.50    Average packs/day: 0.5 packs/day for 10.0 years (5.0 ttl pk-yrs)    Types: Cigarettes   Smokeless tobacco: Never  Vaping Use   Vaping status: Never Used  Substance and Sexual Activity   Alcohol use: No  Drug use: Not Currently    Types: Marijuana    Comment: last 03/23/2017-uses for pain Quit yrs ago   Sexual activity: Not on file  Other Topics Concern   Not on file  Social History Narrative   Not on file   Social Drivers of Health   Financial Resource Strain: Not on file  Food Insecurity: Not on file  Transportation Needs: Not on file  Physical Activity: Not on file  Stress: Not on file  Social Connections: Not on file  Intimate Partner Violence:  Not on file    Outpatient Encounter Medications as of 04/27/2024  Medication Sig   amLODipine  (NORVASC ) 5 MG tablet Take 1 tablet (5 mg total) by mouth daily.   baclofen  (LIORESAL ) 10 MG tablet Take 1 tablet (10 mg total) by mouth 3 (three) times daily.   celecoxib  (CELEBREX ) 200 MG capsule Take 1 capsule (200 mg total) by mouth 2 (two) times daily.   [DISCONTINUED] fluticasone  (FLONASE ) 50 MCG/ACT nasal spray Place 2 sprays into both nostrils daily.   fluticasone  (FLONASE ) 50 MCG/ACT nasal spray Place 2 sprays into both nostrils daily.   No facility-administered encounter medications on file as of 04/27/2024.    Allergies  Allergen Reactions   Codeine Hives    Pertinent ROS per HPI, otherwise unremarkable      Objective:  BP 130/83   Pulse 94   Temp (!) 97 F (36.1 C)   Ht 5' 5 (1.651 m)   Wt 183 lb (83 kg)   SpO2 96%   BMI 30.45 kg/m    Wt Readings from Last 3 Encounters:  04/27/24 183 lb (83 kg)  03/10/24 189 lb 12.8 oz (86.1 kg)  03/07/24 187 lb (84.8 kg)    Physical Exam Vitals and nursing note reviewed.  Constitutional:      General: She is not in acute distress.    Appearance: Normal appearance. She is well-developed and well-groomed. She is ill-appearing. She is not toxic-appearing or diaphoretic.  HENT:     Head: Normocephalic and atraumatic.     Jaw: There is normal jaw occlusion.     Right Ear: Hearing normal. A middle ear effusion is present. Tympanic membrane is not erythematous.     Left Ear: Hearing normal. A middle ear effusion is present. Tympanic membrane is not erythematous.     Nose: Congestion present.     Mouth/Throat:     Lips: Pink.     Mouth: Mucous membranes are moist.     Pharynx: Oropharynx is clear. Uvula midline. No pharyngeal swelling, oropharyngeal exudate, posterior oropharyngeal erythema, uvula swelling or postnasal drip.  Eyes:     General: Lids are normal.     Extraocular Movements: Extraocular movements intact.      Conjunctiva/sclera: Conjunctivae normal.     Pupils: Pupils are equal, round, and reactive to light.  Neck:     Thyroid: No thyroid mass, thyromegaly or thyroid tenderness.     Vascular: No carotid bruit or JVD.     Trachea: Trachea and phonation normal.  Cardiovascular:     Rate and Rhythm: Normal rate and regular rhythm.     Chest Wall: PMI is not displaced.     Pulses: Normal pulses.     Heart sounds: Normal heart sounds. No murmur heard.    No friction rub. No gallop.  Pulmonary:     Effort: Pulmonary effort is normal. No respiratory distress.     Breath sounds: Normal breath sounds. No wheezing.  Abdominal:  General: Bowel sounds are normal. There is no distension or abdominal bruit.     Palpations: Abdomen is soft. There is no hepatomegaly or splenomegaly.     Tenderness: There is no abdominal tenderness. There is no right CVA tenderness or left CVA tenderness.     Hernia: No hernia is present.  Musculoskeletal:     Cervical back: Normal range of motion and neck supple.     Thoracic back: Normal.     Lumbar back: Tenderness present. No swelling, edema, deformity, signs of trauma, lacerations, spasms or bony tenderness. Decreased range of motion. Negative right straight leg raise test and negative left straight leg raise test. No scoliosis.     Right hip: Normal.     Left hip: Normal.     Right lower leg: No edema.     Left lower leg: No edema.  Lymphadenopathy:     Cervical: No cervical adenopathy.  Skin:    General: Skin is warm and dry.     Capillary Refill: Capillary refill takes less than 2 seconds.     Coloration: Skin is not cyanotic, jaundiced or pale.     Findings: No rash.  Neurological:     General: No focal deficit present.     Mental Status: She is alert and oriented to person, place, and time.     Sensory: Sensation is intact.     Motor: Motor function is intact.     Coordination: Coordination is intact.     Gait: Gait is intact.     Deep Tendon  Reflexes: Reflexes are normal and symmetric.  Psychiatric:        Attention and Perception: Attention and perception normal.        Mood and Affect: Mood and affect normal.        Speech: Speech normal.        Behavior: Behavior normal. Behavior is cooperative.        Thought Content: Thought content normal.        Cognition and Memory: Cognition and memory normal.        Judgment: Judgment normal.      Results for orders placed or performed in visit on 03/07/24  CMP14+EGFR   Collection Time: 03/07/24  9:01 AM  Result Value Ref Range   Glucose 86 70 - 99 mg/dL   BUN 9 6 - 24 mg/dL   Creatinine, Ser 9.00 0.57 - 1.00 mg/dL   eGFR 71 >40 fO/fpw/8.26   BUN/Creatinine Ratio 9 9 - 23   Sodium 142 134 - 144 mmol/L   Potassium 4.1 3.5 - 5.2 mmol/L   Chloride 105 96 - 106 mmol/L   CO2 19 (L) 20 - 29 mmol/L   Calcium 9.7 8.7 - 10.2 mg/dL   Total Protein 7.6 6.0 - 8.5 g/dL   Albumin 4.5 3.9 - 4.9 g/dL   Globulin, Total 3.1 1.5 - 4.5 g/dL   Bilirubin Total 0.3 0.0 - 1.2 mg/dL   Alkaline Phosphatase 127 (H) 41 - 116 IU/L   AST 13 0 - 40 IU/L   ALT 10 0 - 32 IU/L       Pertinent labs & imaging results that were available during my care of the patient were reviewed by me and considered in my medical decision making.  Assessment & Plan:  Caelan was seen today for back pain and urinary frequency.  Diagnoses and all orders for this visit:  Dysuria -     Urinalysis, Complete -  WET PREP FOR TRICH, YEAST, CLUE -     Urine Culture  Urinary frequency -     WET PREP FOR TRICH, YEAST, CLUE -     Urine Culture  Bilateral flank pain -     Urinalysis, Complete -     WET PREP FOR TRICH, YEAST, CLUE -     Urine cytology ancillary only  Sinus headache -     fluticasone  (FLONASE ) 50 MCG/ACT nasal spray; Place 2 sprays into both nostrils daily.  Vaginal pruritus -     WET PREP FOR TRICH, YEAST, CLUE -     Urine cytology ancillary only     Flank pain and lower back pain Acute flank  and lower back pain for two days. No urinary tract infection indicated by urine analysis. Dehydration noted with hyaluronic acid and increased specific gravity in urine. - Administered Toradol  injection for pain management - Provided work note for two days off  Dehydration Indicated by urinalysis. Symptoms include weakness and chills. - Increase water intake to improve hydration  Vaginal discharge and irritation White vaginal discharge with itching. Wet prep did not show yeast, bacterial vaginosis, or trichomonas. Differential includes STIs. - Performed wet prep to test for yeast, bacterial vaginosis, and trichomonas - Sent urine for GC and chlamydia testing  Urinary symptoms (dysuria, frequency) Urinary symptoms include frequency and possible dysuria. Urine analysis does not indicate urinary tract infection. Dehydration noted. - Cultured urine to rule out infection - Increase water intake  Sinus symptoms with headache Chronic sinus symptoms with headache, drainage, and nausea. Symptoms worsen at night when lying down. Uses Flonase  for symptom management. - Continue Flonase  for symptom management          Continue all other maintenance medications.  Follow up plan: Return if symptoms worsen or fail to improve.   Continue healthy lifestyle choices, including diet (rich in fruits, vegetables, and lean proteins, and low in salt and simple carbohydrates) and exercise (at least 30 minutes of moderate physical activity daily).    The above assessment and management plan was discussed with the patient. The patient verbalized understanding of and has agreed to the management plan. Patient is aware to call the clinic if they develop any new symptoms or if symptoms persist or worsen. Patient is aware when to return to the clinic for a follow-up visit. Patient educated on when it is appropriate to go to the emergency department.   Rosaline Bruns, FNP-C [facility] [phone]

## 2024-04-27 NOTE — Addendum Note (Signed)
 Addended by: JODENE CORNERS B on: 04/27/2024 12:21 PM   Modules accepted: Orders

## 2024-04-28 LAB — URINE CYTOLOGY ANCILLARY ONLY
Chlamydia: NEGATIVE
Comment: NEGATIVE
Comment: NEGATIVE
Comment: NORMAL
Neisseria Gonorrhea: NEGATIVE
Trichomonas: NEGATIVE

## 2024-04-30 LAB — URINE CULTURE

## 2024-05-18 ENCOUNTER — Encounter: Admitting: Surgical
